# Patient Record
Sex: Male | Born: 1975 | Race: White | Hispanic: No | Marital: Married | Smoking: Never smoker
Health system: Southern US, Community
[De-identification: ages and names within clinical notes are randomized; demographics above are authoritative.]

## PROBLEM LIST (undated history)

## (undated) DIAGNOSIS — IMO0001 Reserved for inherently not codable concepts without codable children: Secondary | ICD-10-CM

## (undated) DIAGNOSIS — F329 Major depressive disorder, single episode, unspecified: Secondary | ICD-10-CM

## (undated) DIAGNOSIS — H9190 Unspecified hearing loss, unspecified ear: Secondary | ICD-10-CM

## (undated) DIAGNOSIS — I10 Essential (primary) hypertension: Secondary | ICD-10-CM

## (undated) DIAGNOSIS — F32A Depression, unspecified: Secondary | ICD-10-CM

## (undated) DIAGNOSIS — F410 Panic disorder [episodic paroxysmal anxiety] without agoraphobia: Secondary | ICD-10-CM

## (undated) DIAGNOSIS — F419 Anxiety disorder, unspecified: Secondary | ICD-10-CM

## (undated) DIAGNOSIS — F319 Bipolar disorder, unspecified: Secondary | ICD-10-CM

## (undated) HISTORY — PX: INNER EAR SURGERY: SHX679

---

## 1998-06-25 ENCOUNTER — Emergency Department (HOSPITAL_COMMUNITY): Admission: EM | Admit: 1998-06-25 | Discharge: 1998-06-25 | Payer: Self-pay | Admitting: Emergency Medicine

## 2010-04-29 ENCOUNTER — Emergency Department (HOSPITAL_BASED_OUTPATIENT_CLINIC_OR_DEPARTMENT_OTHER)
Admission: EM | Admit: 2010-04-29 | Discharge: 2010-04-29 | Payer: Self-pay | Source: Home / Self Care | Admitting: Emergency Medicine

## 2010-06-09 ENCOUNTER — Encounter: Payer: Self-pay | Admitting: Family Medicine

## 2012-04-29 ENCOUNTER — Emergency Department (HOSPITAL_COMMUNITY)
Admission: EM | Admit: 2012-04-29 | Discharge: 2012-04-29 | Disposition: A | Discharge status: Home / Self Care | Payer: No Typology Code available for payment source | Attending: Emergency Medicine | Admitting: Emergency Medicine

## 2012-04-29 ENCOUNTER — Encounter (HOSPITAL_COMMUNITY): Payer: Self-pay | Admitting: Emergency Medicine

## 2012-04-29 ENCOUNTER — Emergency Department (HOSPITAL_COMMUNITY): Payer: No Typology Code available for payment source

## 2012-04-29 DIAGNOSIS — Z79899 Other long term (current) drug therapy: Secondary | ICD-10-CM | POA: Insufficient documentation

## 2012-04-29 DIAGNOSIS — S060XAA Concussion with loss of consciousness status unknown, initial encounter: Secondary | ICD-10-CM

## 2012-04-29 DIAGNOSIS — S069X9A Unspecified intracranial injury with loss of consciousness of unspecified duration, initial encounter: Secondary | ICD-10-CM

## 2012-04-29 DIAGNOSIS — S060X1A Concussion with loss of consciousness of 30 minutes or less, initial encounter: Secondary | ICD-10-CM | POA: Insufficient documentation

## 2012-04-29 DIAGNOSIS — I10 Essential (primary) hypertension: Secondary | ICD-10-CM

## 2012-04-29 DIAGNOSIS — Y9389 Activity, other specified: Secondary | ICD-10-CM | POA: Insufficient documentation

## 2012-04-29 DIAGNOSIS — S060X9A Concussion with loss of consciousness of unspecified duration, initial encounter: Secondary | ICD-10-CM

## 2012-04-29 DIAGNOSIS — S069X1A Unspecified intracranial injury with loss of consciousness of 30 minutes or less, initial encounter: Secondary | ICD-10-CM

## 2012-04-29 HISTORY — DX: Reserved for inherently not codable concepts without codable children: IMO0001

## 2012-04-29 HISTORY — DX: Essential (primary) hypertension: I10

## 2012-04-29 HISTORY — DX: Unspecified hearing loss, unspecified ear: H91.90

## 2012-04-29 MED ORDER — LISINOPRIL-HYDROCHLOROTHIAZIDE 20-25 MG PO TABS: 1.0000 | ORAL_TABLET | Freq: Every day | ORAL | Status: DC

## 2012-04-29 MED ORDER — LISINOPRIL 20 MG PO TABS
20.0000 mg | ORAL_TABLET | Freq: Once | ORAL | Status: AC
  Administered 2012-04-29: 20 mg via ORAL
  Filled 2012-04-29: qty 1

## 2012-04-29 MED ORDER — ALPRAZOLAM 0.5 MG PO TABS
2.0000 mg | ORAL_TABLET | Freq: Once | ORAL | Status: AC
  Administered 2012-04-29: 2 mg via ORAL
  Filled 2012-04-29: qty 4

## 2012-04-29 MED ORDER — HYDROCHLOROTHIAZIDE 12.5 MG PO CAPS
25.0000 mg | ORAL_CAPSULE | Freq: Once | ORAL | Status: AC
  Administered 2012-04-29: 25 mg via ORAL
  Filled 2012-04-29: qty 2

## 2012-04-29 MED ORDER — HYDROCHLOROTHIAZIDE 12.5 MG PO CAPS
12.5000 mg | ORAL_CAPSULE | Freq: Once | ORAL | Status: DC
  Filled 2012-04-29: qty 1

## 2012-04-29 MED ORDER — AMLODIPINE BESYLATE 5 MG PO TABS
5.0000 mg | ORAL_TABLET | Freq: Once | ORAL | Status: DC
  Filled 2012-04-29: qty 1

## 2012-04-29 MED ORDER — METOPROLOL TARTRATE 25 MG PO TABS
50.0000 mg | ORAL_TABLET | Freq: Once | ORAL | Status: DC
  Filled 2012-04-29: qty 2
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
## 2012-04-29 NOTE — ED Notes (Signed)
MD at bedside. 
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
## 2013-02-26 ENCOUNTER — Emergency Department (HOSPITAL_COMMUNITY)
Admission: EM | Admit: 2013-02-26 | Discharge: 2013-02-26 | Disposition: A | Discharge status: Home / Self Care | Payer: Medicare Other | Attending: Emergency Medicine | Admitting: Emergency Medicine

## 2013-02-26 ENCOUNTER — Encounter (HOSPITAL_COMMUNITY): Payer: Self-pay | Admitting: Emergency Medicine

## 2013-02-26 DIAGNOSIS — F4320 Adjustment disorder, unspecified: Secondary | ICD-10-CM

## 2013-02-26 DIAGNOSIS — F319 Bipolar disorder, unspecified: Secondary | ICD-10-CM | POA: Insufficient documentation

## 2013-02-26 DIAGNOSIS — F3130 Bipolar disorder, current episode depressed, mild or moderate severity, unspecified: Secondary | ICD-10-CM

## 2013-02-26 DIAGNOSIS — Z79899 Other long term (current) drug therapy: Secondary | ICD-10-CM | POA: Insufficient documentation

## 2013-02-26 DIAGNOSIS — F329 Major depressive disorder, single episode, unspecified: Secondary | ICD-10-CM

## 2013-02-26 DIAGNOSIS — F4323 Adjustment disorder with mixed anxiety and depressed mood: Secondary | ICD-10-CM | POA: Diagnosis present

## 2013-02-26 DIAGNOSIS — Z8659 Personal history of other mental and behavioral disorders: Secondary | ICD-10-CM

## 2013-02-26 DIAGNOSIS — Z8669 Personal history of other diseases of the nervous system and sense organs: Secondary | ICD-10-CM | POA: Insufficient documentation

## 2013-02-26 DIAGNOSIS — Z630 Problems in relationship with spouse or partner: Secondary | ICD-10-CM

## 2013-02-26 DIAGNOSIS — F3132 Bipolar disorder, current episode depressed, moderate: Secondary | ICD-10-CM | POA: Diagnosis present

## 2013-02-26 DIAGNOSIS — I10 Essential (primary) hypertension: Secondary | ICD-10-CM | POA: Insufficient documentation

## 2013-02-26 MED ORDER — ZIPRASIDONE HCL 20 MG PO CAPS: 80.0000 mg | ORAL_CAPSULE | Freq: Every day | ORAL | Status: DC

## 2013-02-26 MED ORDER — ALPRAZOLAM 0.5 MG PO TABS: 1.0000 mg | ORAL_TABLET | Freq: Three times a day (TID) | ORAL | Status: DC | PRN

## 2013-02-26 MED ORDER — LISINOPRIL 20 MG PO TABS
20.0000 mg | ORAL_TABLET | Freq: Every day | ORAL | Status: DC
  Filled 2013-02-26: qty 1

## 2013-02-26 NOTE — Consult Note (Signed)
Agree with the plan.
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
## 2013-02-26 NOTE — ED Notes (Signed)
Per Dr. Dierdre Highman, pt is refusing blood draw, so we are to hold off on any labs and placing the pt in paper scrubs until psych can evaluate him.

## 2013-02-26 NOTE — ED Notes (Signed)
Pt states he has been fighting depression for several years. Over the last two days, things have gotten worse and he has been fighting with his wife. Wants to be evaluated for depression. Pt denies SI/HI.

## 2013-09-27 ENCOUNTER — Ambulatory Visit
Admission: RE | Admit: 2013-09-27 | Discharge: 2013-09-27 | Disposition: A | Discharge status: Home / Self Care | Payer: Commercial Managed Care - HMO | Source: Ambulatory Visit | Attending: Family Medicine | Admitting: Family Medicine

## 2013-09-27 ENCOUNTER — Other Ambulatory Visit: Payer: Self-pay | Admitting: Family Medicine

## 2013-09-27 DIAGNOSIS — M5412 Radiculopathy, cervical region: Secondary | ICD-10-CM

## 2014-02-10 ENCOUNTER — Ambulatory Visit
Admission: RE | Admit: 2014-02-10 | Discharge: 2014-02-10 | Disposition: A | Discharge status: Home / Self Care | Payer: Commercial Managed Care - HMO | Source: Ambulatory Visit | Attending: Family Medicine | Admitting: Family Medicine

## 2014-02-10 ENCOUNTER — Other Ambulatory Visit: Payer: Self-pay | Admitting: Family Medicine

## 2014-02-10 DIAGNOSIS — R079 Chest pain, unspecified: Secondary | ICD-10-CM

## 2014-06-16 DIAGNOSIS — F4001 Agoraphobia with panic disorder: Secondary | ICD-10-CM | POA: Diagnosis not present

## 2014-06-16 DIAGNOSIS — F319 Bipolar disorder, unspecified: Secondary | ICD-10-CM | POA: Diagnosis not present

## 2014-06-28 DIAGNOSIS — I10 Essential (primary) hypertension: Secondary | ICD-10-CM | POA: Diagnosis not present

## 2014-08-02 DIAGNOSIS — H903 Sensorineural hearing loss, bilateral: Secondary | ICD-10-CM | POA: Diagnosis not present

## 2014-08-22 DIAGNOSIS — F4001 Agoraphobia with panic disorder: Secondary | ICD-10-CM | POA: Diagnosis not present

## 2014-08-22 DIAGNOSIS — F319 Bipolar disorder, unspecified: Secondary | ICD-10-CM | POA: Diagnosis not present

## 2014-09-14 DIAGNOSIS — F319 Bipolar disorder, unspecified: Secondary | ICD-10-CM | POA: Diagnosis not present

## 2014-09-14 DIAGNOSIS — F4001 Agoraphobia with panic disorder: Secondary | ICD-10-CM | POA: Diagnosis not present

## 2014-09-19 DIAGNOSIS — F4001 Agoraphobia with panic disorder: Secondary | ICD-10-CM | POA: Diagnosis not present

## 2014-09-19 DIAGNOSIS — F319 Bipolar disorder, unspecified: Secondary | ICD-10-CM | POA: Diagnosis not present

## 2014-12-13 DIAGNOSIS — F319 Bipolar disorder, unspecified: Secondary | ICD-10-CM | POA: Diagnosis not present

## 2014-12-27 DIAGNOSIS — I10 Essential (primary) hypertension: Secondary | ICD-10-CM | POA: Diagnosis not present

## 2015-02-26 DIAGNOSIS — F319 Bipolar disorder, unspecified: Secondary | ICD-10-CM | POA: Diagnosis not present

## 2015-03-05 ENCOUNTER — Observation Stay (HOSPITAL_BASED_OUTPATIENT_CLINIC_OR_DEPARTMENT_OTHER)
Admission: EM | Admit: 2015-03-05 | Discharge: 2015-03-07 | Disposition: A | Discharge status: Home / Self Care | Payer: Commercial Managed Care - HMO | Attending: Internal Medicine | Admitting: Internal Medicine

## 2015-03-05 ENCOUNTER — Emergency Department (HOSPITAL_BASED_OUTPATIENT_CLINIC_OR_DEPARTMENT_OTHER): Payer: Commercial Managed Care - HMO

## 2015-03-05 ENCOUNTER — Encounter (HOSPITAL_BASED_OUTPATIENT_CLINIC_OR_DEPARTMENT_OTHER): Payer: Self-pay | Admitting: Emergency Medicine

## 2015-03-05 ENCOUNTER — Observation Stay
Admission: AD | Admit: 2015-03-05 | Payer: Self-pay | Source: Other Acute Inpatient Hospital | Admitting: Internal Medicine

## 2015-03-05 DIAGNOSIS — R110 Nausea: Secondary | ICD-10-CM | POA: Diagnosis not present

## 2015-03-05 DIAGNOSIS — F3132 Bipolar disorder, current episode depressed, moderate: Secondary | ICD-10-CM | POA: Insufficient documentation

## 2015-03-05 DIAGNOSIS — F4323 Adjustment disorder with mixed anxiety and depressed mood: Secondary | ICD-10-CM | POA: Diagnosis present

## 2015-03-05 DIAGNOSIS — R079 Chest pain, unspecified: Principal | ICD-10-CM | POA: Insufficient documentation

## 2015-03-05 DIAGNOSIS — Z8659 Personal history of other mental and behavioral disorders: Secondary | ICD-10-CM

## 2015-03-05 DIAGNOSIS — R072 Precordial pain: Secondary | ICD-10-CM | POA: Diagnosis not present

## 2015-03-05 HISTORY — DX: Depression, unspecified: F32.A

## 2015-03-05 HISTORY — DX: Bipolar disorder, unspecified: F31.9

## 2015-03-05 HISTORY — DX: Chest pain, unspecified: R07.9

## 2015-03-05 HISTORY — DX: Anxiety disorder, unspecified: F41.9

## 2015-03-05 HISTORY — DX: Panic disorder (episodic paroxysmal anxiety): F41.0

## 2015-03-05 HISTORY — DX: Major depressive disorder, single episode, unspecified: F32.9

## 2015-03-05 LAB — BASIC METABOLIC PANEL
Anion gap: 7 (ref 5–15)
BUN: 18 mg/dL (ref 6–20)
CALCIUM: 9.1 mg/dL (ref 8.9–10.3)
CO2: 26 mmol/L (ref 22–32)
CREATININE: 1.15 mg/dL (ref 0.61–1.24)
Chloride: 106 mmol/L (ref 101–111)
GFR calc Af Amer: >60 mL/min (ref >60)
GFR calc non Af Amer: >60 mL/min (ref >60)
GLUCOSE: 148 mg/dL — AB (ref 65–99)
Potassium: 3.5 mmol/L (ref 3.5–5.1)
Sodium: 139 mmol/L (ref 135–145)

## 2015-03-05 LAB — TROPONIN I

## 2015-03-05 LAB — DIFFERENTIAL
BASOS ABS: 0.1 10*3/uL (ref 0.0–0.1)
Basophils Relative: 0 %
EOS PCT: 1 %
Eosinophils Absolute: 0.1 10*3/uL (ref 0.0–0.7)
LYMPHS ABS: 3.9 10*3/uL (ref 0.7–4.0)
Lymphocytes Relative: 30 %
MONOS PCT: 7 %
Monocytes Absolute: 0.9 10*3/uL (ref 0.1–1.0)
NEUTROS PCT: 62 %
Neutro Abs: 8.1 10*3/uL — ABNORMAL HIGH (ref 1.7–7.7)

## 2015-03-05 LAB — CBC
HCT: 44.3 % (ref 39.0–52.0)
Hemoglobin: 15.8 g/dL (ref 13.0–17.0)
MCH: 29.6 pg (ref 26.0–34.0)
MCHC: 35.7 g/dL (ref 30.0–36.0)
MCV: 83.1 fL (ref 78.0–100.0)
Platelets: 295 10*3/uL (ref 150–400)
RBC: 5.33 MIL/uL (ref 4.22–5.81)
RDW: 13.7 % (ref 11.5–15.5)
WBC: 13.1 10*3/uL — ABNORMAL HIGH (ref 4.0–10.5)

## 2015-03-05 MED ORDER — ZIPRASIDONE HCL 80 MG PO CAPS
80.0000 mg | ORAL_CAPSULE | Freq: Every day | ORAL | Status: DC
Start: 2015-03-05 — End: 2015-03-06
  Administered 2015-03-06: 80 mg via ORAL
  Filled 2015-03-05 (×3): qty 1

## 2015-03-05 MED ORDER — ASPIRIN 81 MG PO CHEW
324.0000 mg | CHEWABLE_TABLET | Freq: Once | ORAL | Status: AC
  Administered 2015-03-05: 324 mg via ORAL
  Filled 2015-03-05: qty 4

## 2015-03-05 MED ORDER — GABAPENTIN 100 MG PO CAPS
100.0000 mg | ORAL_CAPSULE | Freq: Three times a day (TID) | ORAL | Status: DC
  Administered 2015-03-05 – 2015-03-06 (×3): 100 mg via ORAL
  Filled 2015-03-05 (×4): qty 1

## 2015-03-05 MED ORDER — METOPROLOL SUCCINATE ER 50 MG PO TB24
50.0000 mg | ORAL_TABLET | Freq: Every day | ORAL | Status: DC
  Administered 2015-03-06 – 2015-03-07 (×2): 50 mg via ORAL
  Filled 2015-03-05 (×3): qty 1

## 2015-03-05 MED ORDER — LOSARTAN POTASSIUM 50 MG PO TABS
100.0000 mg | ORAL_TABLET | Freq: Every day | ORAL | Status: DC
  Administered 2015-03-06 – 2015-03-07 (×2): 100 mg via ORAL
  Filled 2015-03-05 (×3): qty 2

## 2015-03-05 MED ORDER — NITROGLYCERIN 0.4 MG SL SUBL
0.4000 mg | SUBLINGUAL_TABLET | SUBLINGUAL | Status: AC | PRN
  Administered 2015-03-05 (×3): 0.4 mg via SUBLINGUAL
  Filled 2015-03-05 (×2): qty 1

## 2015-03-05 MED ORDER — ALPRAZOLAM 0.5 MG PO TABS
1.0000 mg | ORAL_TABLET | Freq: Four times a day (QID) | ORAL | Status: DC | PRN
Start: 2015-03-05 — End: 2015-03-07
  Administered 2015-03-06 (×3): 1 mg via ORAL
  Filled 2015-03-05 (×4): qty 2

## 2015-03-05 NOTE — ED Provider Notes (Signed)
CSN: [id number]     Arrival date & time 03/05/15  1954 History  By signing my name below, I, Lyndel Safe, attest that this documentation has been prepared under the direction and in the presence of Dione Booze, MD. Electronically Signed: Lyndel Safe, ED Scribe. 03/05/2015. 9:01 PM.   Chief Complaint  Patient presents with  . Chest Pain   The history is provided by the patient and the spouse. No language interpreter was used.   HPI Comments: Darrell Nunez is a 39 y.o. male, with a PMhx of anxiety and HTN, who presents to the Emergency Department complaining of waxing and waning, 8/10, CP that he describes as a pressure to mid sternum of chest onset 4 hours ago. Per wife, the pt began to complain of pain in mid chest that radiated to upper back onset 4 hours ago while out running errands. Pt associates nausea, decreased appetite, and diaphoresis, but he states he sweats excessively daily at baseline. Laying down alleviates his CP and movement exacerbates his CP. Denies cough, SOB, vomiting, diarrhea or constipation. No PMhx of DM or HLD. Pt does not smoke tobacco products or consume EtOH. PFhx of HTN in mother and maternal grandfather. Strong family history of DM. PCP: Merri Brunette, MD.    Past Medical History  Diagnosis Date  . Hypertension   . Hearing impairment    Past Surgical History  Procedure Laterality Date  . Inner ear surgery     History reviewed. No pertinent family history. Social History  Substance Use Topics  . Smoking status: Never Smoker   . Smokeless tobacco: Never Used  . Alcohol Use: No    Review of Systems  Constitutional: Positive for diaphoresis and appetite change.  Respiratory: Negative for cough and shortness of breath.   Cardiovascular: Positive for chest pain.  Gastrointestinal: Positive for nausea. Negative for vomiting, diarrhea and constipation.  All other systems reviewed and are negative.  Allergies  Depakote and Lamictal  Home  Medications   Prior to Admission medications   Medication Sig Start Date End Date Taking? Authorizing Provider  losartan (COZAAR) 100 MG tablet Take 100 mg by mouth daily.   Yes Historical Provider, MD  metoprolol succinate (TOPROL-XL) 50 MG 24 hr tablet Take 50 mg by mouth daily. Take with or immediately following a meal.   Yes Historical Provider, MD  ALPRAZolam Prudy Feeler) 1 MG tablet Take 1 mg by mouth 4 (four) times daily as needed. Anxiety    Historical Provider, MD  aspirin-acetaminophen-caffeine (EXCEDRIN MIGRAINE) [phone] MG per tablet Take 1 tablet by mouth every 6 (six) hours as needed for pain (headache).    Historical Provider, MD  gabapentin (NEURONTIN) 100 MG capsule Take 100 mg by mouth 3 (three) times daily.    Historical Provider, MD  Pseudoephedrine-Ibuprofen (ADVIL COLD & SINUS LIQUI-GELS PO) Take 2 capsules by mouth as needed. cold    Historical Provider, MD  ziprasidone (GEODON) 80 MG capsule Take 80 mg by mouth at bedtime.    Historical Provider, MD   BP 187/109 mmHg  Pulse 64  Temp(Src) 98.3 F (36.8 C) (Oral)  Resp 20  Ht  (1.727 m)  Wt 277 lb (125.646 kg)  BMI 42.13 kg/m2  SpO2 100% Physical Exam  Constitutional: He is oriented to person, place, and time. He appears well-developed and well-nourished.  HENT:  Head: Normocephalic and atraumatic.  Eyes: EOM are normal. Pupils are equal, round, and reactive to light.  Neck: Normal range of motion. Neck supple. No  JVD present.  Cardiovascular: Normal rate, regular rhythm and normal heart sounds.   No murmur heard. Pulmonary/Chest: Effort normal and breath sounds normal. He has no wheezes. He has no rales. He exhibits tenderness.  Moderate tenderness left parasternal that does not reproduce his pain.   Abdominal: Soft. Bowel sounds are normal. He exhibits no distension and no mass. There is no tenderness.  Musculoskeletal: Normal range of motion. He exhibits no edema.  Lymphadenopathy:    He has no cervical  adenopathy.  Neurological: He is alert and oriented to person, place, and time. No cranial nerve deficit. He exhibits normal muscle tone. Coordination normal.  Skin: Skin is warm and dry. No rash noted.  Psychiatric: He has a normal mood and affect. His behavior is normal. Judgment and thought content normal.  Nursing note and vitals reviewed.   ED Course  Procedures  DIAGNOSTIC STUDIES: Oxygen Saturation is 100% on RA, normal by my interpretation.    COORDINATION OF CARE: 9:00 PM Discussed treatment plan with pt at bedside and pt agreed to plan. Will order 324mg  aspirin and NTG 0.4mg . Cardiac workup ordered.    Labs Review Results for orders placed or performed during the hospital encounter of 03/05/15  Basic metabolic panel  Result Value Ref Range   Sodium 139 135 - 145 mmol/L   Potassium 3.5 3.5 - 5.1 mmol/L   Chloride 106 101 - 111 mmol/L   CO2 26 22 - 32 mmol/L   Glucose, Bld 148 (H) 65 - 99 mg/dL   BUN 18 6 - 20 mg/dL   Creatinine, Ser 1.611.15 0.61 - 1.24 mg/dL   Calcium 9.1 8.9 - 09.610.3 mg/dL   GFR calc non Af Amer >60 >60 mL/min   GFR calc Af Amer >60 >60 mL/min   Anion gap 7 5 - 15  CBC  Result Value Ref Range   WBC 13.1 (H) 4.0 - 10.5 K/uL   RBC 5.33 4.22 - 5.81 MIL/uL   Hemoglobin 15.8 13.0 - 17.0 g/dL   HCT 04.544.3 40.939.0 - 81.152.0 %   MCV 83.1 78.0 - 100.0 fL   MCH 29.6 26.0 - 34.0 pg   MCHC 35.7 30.0 - 36.0 g/dL   RDW 91.413.7 78.211.5 - 95.615.5 %   Platelets 295 150 - 400 K/uL  Troponin I  Result Value Ref Range   Troponin I <0.03 <0.031 ng/mL  Differential  Result Value Ref Range   Neutro Abs 8.1 (H) 1.7 - 7.7 K/uL   Lymphs Abs 3.9 0.7 - 4.0 K/uL   Monocytes Absolute 0.9 0.1 - 1.0 K/uL   Eosinophils Absolute 0.1 0.0 - 0.7 K/uL   Basophils Absolute 0.1 0.0 - 0.1 K/uL   Neutrophils Relative % 62 %   Lymphocytes Relative 30 %   Monocytes Relative 7 %   Eosinophils Relative 1 %   Basophils Relative 0 %   Smear Review MORPHOLOGY UNREMARKABLE    Imaging Review Dg Chest 2  View  03/05/2015  CLINICAL DATA:  Central to LEFT side chest pain beginning at 1600 hours today, nausea, diaphoresis, history hypertension EXAM: CHEST  2 VIEW COMPARISON:  02/10/2014 FINDINGS: Normal heart size, mediastinal contours, and pulmonary vascularity. Lungs clear. No pleural effusion or pneumothorax. Bones unremarkable. IMPRESSION: Normal exam. Electronically Signed   By: Ulyses SouthwardMark  Boles M.D.   On: 03/05/2015 20:54   I have personally reviewed and evaluated these images and lab results as part of my medical decision-making.   EKG Interpretation   Date/Time:  Monday March 05 2015 20:08:01 EDT Ventricular Rate:  71 PR Interval:  164 QRS Duration: 102 QT Interval:  410 QTC Calculation: 445 R Axis:   41 Text Interpretation:  Normal sinus rhythm Cannot rule out Inferior infarct  , age undetermined Abnormal ECG No old tracing to compare Confirmed by  [facility]  MD, Pina Sirianni ([id number]) on 03/05/2015 8:07:23 PM      MDM   Final diagnoses:  Chest pain, unspecified    Chest pain of uncertain cause. He was given aspirin in the ED and was given nitroglycerin with complete relief of pain although did require 3 nitroglycerin to achieve relief. ECG is unremarkable and initial troponin is negative. Heart score is only 2, but I am concerned enough about his history that I feel he needs monitoring for serial troponins and may need cardiology referral for stress testing. This was cleaned the patient and family. Case is discussed with Dr. Julian Reil of [facility] agrees to admit the patient.  I, Cornell Bourbon, personally performed the services described in this documentation. All medical record entries made by the scribe were at my direction and in my presence.  I have reviewed the chart and discharge instructions and agree that the record reflects my personal performance and is accurate and complete. Antinette Keough.  03/05/2015. 11:30 PM.      Dione Booze, MD 03/05/15 2330

## 2015-03-05 NOTE — ED Notes (Signed)
Pt was only given 2 baby asa,  After scanning asa pt told rn he had already taken 2

## 2015-03-05 NOTE — ED Notes (Signed)
Pt c/i mid chest pain that started in rt rib area radiating into rt abd to rt chest then to cemntral chest area,  Very sweaty,  Denies n/v

## 2015-03-05 NOTE — ED Notes (Signed)
Patient states that he is having Chest pain today that started about 4 pm - the patient is nauseated and diaphoretic. Patient is very anxious and reports that he is having cotton mouth.

## 2015-03-05 NOTE — ED Notes (Signed)
Patient is pale and very diaphoretic in triage, however as he sits in triage longer he is less and less anxious. The patient however is grimacing in pain frequently and asking for water.

## 2015-03-06 ENCOUNTER — Encounter (HOSPITAL_COMMUNITY): Payer: Commercial Managed Care - HMO

## 2015-03-06 ENCOUNTER — Observation Stay (HOSPITAL_BASED_OUTPATIENT_CLINIC_OR_DEPARTMENT_OTHER): Payer: Commercial Managed Care - HMO

## 2015-03-06 ENCOUNTER — Observation Stay (HOSPITAL_COMMUNITY): Payer: Commercial Managed Care - HMO

## 2015-03-06 ENCOUNTER — Encounter (HOSPITAL_COMMUNITY): Payer: Self-pay | Admitting: *Deleted

## 2015-03-06 DIAGNOSIS — R079 Chest pain, unspecified: Secondary | ICD-10-CM | POA: Diagnosis not present

## 2015-03-06 DIAGNOSIS — R072 Precordial pain: Secondary | ICD-10-CM

## 2015-03-06 DIAGNOSIS — F3132 Bipolar disorder, current episode depressed, moderate: Secondary | ICD-10-CM | POA: Diagnosis not present

## 2015-03-06 LAB — TROPONIN I
Troponin I: <0.03 ng/mL (ref <0.031)
Troponin I: <0.03 ng/mL (ref <0.031)

## 2015-03-06 MED ORDER — ONDANSETRON HCL 4 MG/2ML IJ SOLN: 4.0000 mg | Freq: Four times a day (QID) | INTRAMUSCULAR | Status: DC | PRN

## 2015-03-06 MED ORDER — TECHNETIUM TC 99M SESTAMIBI GENERIC - CARDIOLITE
30.0000 | Freq: Once | INTRAVENOUS | Status: AC | PRN
  Administered 2015-03-06: 30 via INTRAVENOUS

## 2015-03-06 MED ORDER — REGADENOSON 0.4 MG/5ML IV SOLN
INTRAVENOUS | Status: AC
  Administered 2015-03-06: 0.4 mg via INTRAVENOUS
  Filled 2015-03-06: qty 5

## 2015-03-06 MED ORDER — REGADENOSON 0.4 MG/5ML IV SOLN
0.4000 mg | Freq: Once | INTRAVENOUS | Status: AC
Start: 2015-03-06 — End: 2015-03-06
  Administered 2015-03-06: 0.4 mg via INTRAVENOUS
  Filled 2015-03-06: qty 5

## 2015-03-06 MED ORDER — ACETAMINOPHEN 325 MG PO TABS
650.0000 mg | ORAL_TABLET | ORAL | Status: DC | PRN
  Administered 2015-03-06 – 2015-03-07 (×4): 650 mg via ORAL
  Filled 2015-03-06 (×4): qty 2

## 2015-03-06 MED ORDER — ACETAMINOPHEN 325 MG PO TABS: 650.0000 mg | ORAL_TABLET | Freq: Four times a day (QID) | ORAL | Status: DC | PRN

## 2015-03-06 MED ORDER — OXCARBAZEPINE 150 MG PO TABS: 150.0000 mg | ORAL_TABLET | Freq: Every day | ORAL | Status: DC

## 2015-03-06 MED ORDER — ENOXAPARIN SODIUM 40 MG/0.4ML ~~LOC~~ SOLN
40.0000 mg | SUBCUTANEOUS | Status: DC
  Administered 2015-03-06 – 2015-03-07 (×2): 40 mg via SUBCUTANEOUS
  Filled 2015-03-06 (×2): qty 0.4

## 2015-03-06 MED ORDER — OXCARBAZEPINE 150 MG PO TABS
150.0000 mg | ORAL_TABLET | Freq: Every day | ORAL | Status: DC
  Administered 2015-03-06 (×2): 150 mg via ORAL
  Filled 2015-03-06 (×3): qty 1

## 2015-03-06 MED ORDER — ZIPRASIDONE HCL 40 MG PO CAPS
40.0000 mg | ORAL_CAPSULE | Freq: Every day | ORAL | Status: DC
Start: 2015-03-06 — End: 2015-03-07
  Administered 2015-03-06: 40 mg via ORAL
  Filled 2015-03-06 (×3): qty 1

## 2015-03-06 NOTE — H&P (Signed)
[facility] History and Physical  Sabian Johney Frame ZOX:[id number] DOB: 1976/03/01 DOA: 03/05/2015  Referring physician: EDP PCP: Leanor Rubenstein, MD   Chief Complaint: Chest pain   HPI: Darrell Nunez is a 39 y.o. male with h/o HTN, patient presents to the ED with c/o chest pain.  8/10 in intensity, waxing and waning symptoms.  Pressure like sensation to mid sternum.  Onset 4 hours PTA while out running errands.  Associated nausea.  No SOB, vomiting.  Activity makes CP worse, laying down makes it better.  Review of Systems: Systems reviewed.  As above, otherwise negative  Past Medical History  Diagnosis Date  . Hypertension   . Hearing impairment   . Bipolar disorder (HCC)   . Depression   . Anxiety   . Panic attacks    Past Surgical History  Procedure Laterality Date  . Inner ear surgery     Social History:  reports that he has never smoked. He has never used smokeless tobacco. He reports that he does not drink alcohol or use illicit drugs.  Allergies  Allergen Reactions  . Depakote [Divalproex Sodium] Nausea And Vomiting  . Lamictal [Lamotrigine] Nausea And Vomiting    Family History  Problem Relation Age of Onset  . Other Mother     factor five leiden  . Heart attack Maternal Grandfather   . Diabetes Maternal Grandfather   . Diabetes Maternal Grandmother      Prior to Admission medications   Medication Sig Start Date End Date Taking? Authorizing Provider  losartan (COZAAR) 100 MG tablet Take 100 mg by mouth daily.   Yes Historical Provider, MD  metoprolol succinate (TOPROL-XL) 50 MG 24 hr tablet Take 50 mg by mouth daily. Take with or immediately following a meal.   Yes Historical Provider, MD  ALPRAZolam Prudy Feeler) 1 MG tablet Take 1 mg by mouth 4 (four) times daily as needed. Anxiety    Historical Provider, MD  aspirin-acetaminophen-caffeine (EXCEDRIN MIGRAINE) [phone] MG per tablet Take 1 tablet by mouth every 6 (six) hours as needed for pain (headache).     Historical Provider, MD  gabapentin (NEURONTIN) 100 MG capsule Take 100 mg by mouth 3 (three) times daily.    Historical Provider, MD  Pseudoephedrine-Ibuprofen (ADVIL COLD & SINUS LIQUI-GELS PO) Take 2 capsules by mouth as needed. cold    Historical Provider, MD  ziprasidone (GEODON) 80 MG capsule Take 80 mg by mouth at bedtime.    Historical Provider, MD   Physical Exam: Filed Vitals:   03/06/15 0125  BP: 145/100  Pulse: 79  Temp: 98.8 F (37.1 C)  Resp: 16    BP 145/100 mmHg  Pulse 79  Temp(Src) 98.8 F (37.1 C) (Oral)  Resp 16  Ht  (1.727 m)  Wt 128.368 kg (283 lb)  BMI 43.04 kg/m2  SpO2 98%  General Appearance:    Alert, oriented, no distress, appears stated age  Head:    Normocephalic, atraumatic  Eyes:    PERRL, EOMI, sclera non-icteric        Nose:   Nares without drainage or epistaxis. Mucosa, turbinates normal  Throat:   Moist mucous membranes. Oropharynx without erythema or exudate.  Neck:   Supple. No carotid bruits.  No thyromegaly.  No lymphadenopathy.   Back:     No CVA tenderness, no spinal tenderness  Lungs:     Clear to auscultation bilaterally, without wheezes, rhonchi or rales  Chest wall:    No tenderness to palpitation  Heart:  Regular rate and rhythm without murmurs, gallops, rubs  Abdomen:     Soft, non-tender, nondistended, normal bowel sounds, no organomegaly  Genitalia:    deferred  Rectal:    deferred  Extremities:   No clubbing, cyanosis or edema.  Pulses:   2+ and symmetric all extremities  Skin:   Skin color, texture, turgor normal, no rashes or lesions  Lymph nodes:   Cervical, supraclavicular, and axillary nodes normal  Neurologic:   CNII-XII intact. Normal strength, sensation and reflexes      throughout    Labs on Admission:  Basic Metabolic Panel:  Recent Labs Lab 03/05/15 2020  NA 139  K 3.5  CL 106  CO2 26  GLUCOSE 148*  BUN 18  CREATININE 1.15  CALCIUM 9.1   Liver Function Tests: No results for input(s): AST,  ALT, ALKPHOS, BILITOT, PROT, ALBUMIN in the last 168 hours. No results for input(s): LIPASE, AMYLASE in the last 168 hours. No results for input(s): AMMONIA in the last 168 hours. CBC:  Recent Labs Lab 03/05/15 2020  WBC 13.1*  NEUTROABS 8.1*  HGB 15.8  HCT 44.3  MCV 83.1  PLT 295   Cardiac Enzymes:  Recent Labs Lab 03/05/15 2020 03/05/15 2340  TROPONINI <0.03 <0.03    BNP (last 3 results) No results for input(s): PROBNP in the last 8760 hours. CBG: No results for input(s): GLUCAP in the last 168 hours.  Radiological Exams on Admission: Dg Chest 2 View  03/05/2015  CLINICAL DATA:  Central to LEFT side chest pain beginning at 1600 hours today, nausea, diaphoresis, history hypertension EXAM: CHEST  2 VIEW COMPARISON:  02/10/2014 FINDINGS: Normal heart size, mediastinal contours, and pulmonary vascularity. Lungs clear. No pleural effusion or pneumothorax. Bones unremarkable. IMPRESSION: Normal exam. Electronically Signed   By: Ulyses SouthwardMark  Boles M.D.   On: 03/05/2015 20:54    EKG: Independently reviewed. Does appear to have Q waves in III and AVF, no old tracing for comparison.  Assessment/Plan Principal Problem:   Chest pain, unspecified Active Problems:   Bipolar affective disorder, depressed, moderate (HCC)   1. Chest pain, unspecified -  1. Serial trops ordered 2. Chest pain obs pathway 3. NPO after midnight 4. Tele monitor 5. HEART score of 4    Code Status: Full Code  Family Communication: Family at bedside Disposition Plan: Admit to obs   Time spent: 50 min  Cleland Simkins M. [facility] Pager [phone]  If 7AM-7PM, please contact the day team taking care of the patient [email or web address] Password [email or web address] 03/06/2015, 3:05 AM

## 2015-03-06 NOTE — Consult Note (Signed)
CARDIOLOGY CONSULT NOTE   Patient ID: Sacramento Monds MRN: [id number] DOB/AGE: [birth year] 39 y.o.  Admit date: 03/05/2015  Primary Physician   Leanor Rubenstein, MD Primary Cardiologist   New Reason for Consultation   CP  HPI: Darrell Nunez is a 39 y.o. male with a history of HTN, bipolar disorders, anxiety and hearing impairment who presented to ED 03/05/15 for evaluation of chest pain.    No prior cardiac history. Never seen by any cardiologist. Patient has developed substernal chest pain while driving back to home from bank, rates as 10 out of 10. He described the pain as a pressure/achy that associated with diaphoresis. He went home and he continued to have a substernal chest pressure for approximately 4 hours, that him to come to ED for further evaluation. His pain improved after sublingual nitroglycerin x 3 in ED. He is somewhat sweaty at baseline. Per wife, nothing made his pain better or worse.   The patient denies nausea, vomiting, fever, palpitations, shortness of breath, orthopnea, PND, dizziness, syncope, cough, congestion, abdominal pain, hematochezia, melena, lower extremity edema. Denies tobacco smoking, alcohol abuse or any illicit drugs. His maternal grandfather had a heart attack in his 26s. Maternal grandmother has diabetes, hypertension, and pacemaker placement. His mom has hypertension and diabetes. Unknown father past medical history. Per patient his last cholesterol check 2 months ago was normal.  Currently he denies any chest pain or shortness of breath. Troponin x 3 negative. EKG without acute ST or T-wave abnormality. Telemetry without any arrhythmia. Chest x-ray without acute finding.  Past Medical History  Diagnosis Date  . Hypertension   . Hearing impairment   . Bipolar disorder (HCC)   . Depression   . Anxiety   . Panic attacks      Past Surgical History  Procedure Laterality Date  . Inner ear surgery      Allergies  Allergen Reactions  . Depakote  [Divalproex Sodium] Nausea And Vomiting  . Lamictal [Lamotrigine] Nausea And Vomiting    I have reviewed the patient's current medications . enoxaparin (LOVENOX) injection  40 mg Subcutaneous Q24H  . gabapentin  100 mg Oral TID  . losartan  100 mg Oral Daily  . metoprolol succinate  50 mg Oral Daily  . OXcarbazepine  150 mg Oral QHS  . ziprasidone  80 mg Oral QHS     acetaminophen, ALPRAZolam, ondansetron (ZOFRAN) IV  Prior to Admission medications   Medication Sig Start Date End Date Taking? Authorizing Provider  losartan (COZAAR) 100 MG tablet Take 100 mg by mouth daily.   Yes Historical Provider, MD  metoprolol succinate (TOPROL-XL) 50 MG 24 hr tablet Take 50 mg by mouth daily. Take with or immediately following a meal.   Yes Historical Provider, MD  ALPRAZolam Prudy Feeler) 1 MG tablet Take 1 mg by mouth 4 (four) times daily as needed. Anxiety    Historical Provider, MD  aspirin-acetaminophen-caffeine (EXCEDRIN MIGRAINE) [phone] MG per tablet Take 1 tablet by mouth every 6 (six) hours as needed for pain (headache).    Historical Provider, MD  gabapentin (NEURONTIN) 100 MG capsule Take 100 mg by mouth 3 (three) times daily.    Historical Provider, MD  Pseudoephedrine-Ibuprofen (ADVIL COLD & SINUS LIQUI-GELS PO) Take 2 capsules by mouth as needed. cold    Historical Provider, MD  ziprasidone (GEODON) 80 MG capsule Take 80 mg by mouth at bedtime.    Historical Provider, MD     Social History   Social History  . Marital  Status: Married    Spouse Name: N/A  . Number of Children: N/A  . Years of Education: N/A   Occupational History  . Not on file.   Social History Main Topics  . Smoking status: Never Smoker   . Smokeless tobacco: Never Used  . Alcohol Use: No  . Drug Use: No  . Sexual Activity: Not on file   Other Topics Concern  . Not on file   Social History Narrative    Family Status  Relation Status Death Age  . Mother Alive   . Maternal Grandfather Deceased   .  Maternal Grandmother Alive    Family History  Problem Relation Age of Onset  . Other Mother     factor five leiden  . Heart attack Maternal Grandfather   . Diabetes Maternal Grandfather   . Diabetes Maternal Grandmother      ROS:  Full 14 point review of systems complete and found to be negative unless listed above.  Physical Exam: Blood pressure 145/100, pulse 79, temperature 98.8 F (37.1 C), temperature source Oral, resp. rate 16, height 5\' 8"  (1.727 m), weight 283 lb (128.368 kg), SpO2 98 %.  General: Well developed, well nourished, male in no acute distress Head: Eyes PERRLA, No xanthomas. Normocephalic and atraumatic, oropharynx without edema or exudate.  Lungs: Resp regular and unlabored, CTA. Heart: RRR no s3, s4, or murmurs..   Neck: No carotid bruits. No lymphadenopathy.  No JVD. Abdomen: Bowel sounds present, abdomen soft and non-tender without masses or hernias noted. Msk:  No spine or cva tenderness. No weakness, no joint deformities or effusions. Extremities: No clubbing, cyanosis or edema. DP/PT/Radials 2+ and equal bilaterally. Neuro: Alert and oriented X 3. No focal deficits noted. Psych:  Good affect, responds appropriately Skin: No rashes or lesions noted.  Labs:   Lab Results  Component Value Date   WBC 13.1* 03/05/2015   HGB 15.8 03/05/2015   HCT 44.3 03/05/2015   MCV 83.1 03/05/2015   PLT 295 03/05/2015   No results for input(s): INR in the last 72 hours.  Recent Labs Lab 03/05/15 2020  NA 139  K 3.5  CL 106  CO2 26  BUN 18  CREATININE 1.15  CALCIUM 9.1  GLUCOSE 148*   No results found for: MG  Recent Labs  03/05/15 2020 03/05/15 2340 03/06/15 0531  TROPONINI <0.03 <0.03 <0.03    Echo: pending  ECG:  Vent. rate 71 BPM PR interval 164 ms QRS duration 102 ms QT/QTc 410/445 ms  Radiology:  Dg Chest 2 View  03/05/2015  CLINICAL DATA:  Central to LEFT side chest pain beginning at 1600 hours today, nausea, diaphoresis, history  hypertension EXAM: CHEST  2 VIEW COMPARISON:  02/10/2014 FINDINGS: Normal heart size, mediastinal contours, and pulmonary vascularity. Lungs clear. No pleural effusion or pneumothorax. Bones unremarkable. IMPRESSION: Normal exam. Electronically Signed   By: Ulyses SouthwardMark  Boles M.D.   On: 03/05/2015 20:54    ASSESSMENT AND PLAN:     1. Chest pain - His chest pain has both typical and atypical features. Sternal chest pressure/achyness associated with diaphoresis that lasted for approximately 4 hours. His pain improved after sublingual nitroglycerin x 3. Troponin x 3 negative. EKG without acute ST or T-wave abnormality. Telemetry without any arrhythmia. Chest x-ray without acute finding. - At presentation to ED his blood pressure was 182/120. Currently his chest pain from uncontrolled hypertension and most recent blood pressure reading 145/100. - He has a strong family history of cardiac  problems. Given her morbid obesity and hypertension, will discuss plan with Dr. Myrtis Ser.   2. HTN - Continue Toprol XL and losartan.  - At presentation his BP was 180/120. As above  3. Hyperglycemia - consider HgbA1c  4. Bipolar affective disorder, depressed, moderate (HCC) - Per primary  5. morbid obesity - Estimated body mass index is 43.04 kg/(m^2) as calculated from the following:   Height as of this encounter:  (1.727 m).   Weight as of this encounter: 283 lb (128.368 kg).  - Encouraged lifestyle change and daily exercise  Signed: Bhagat,Bhavinkumar, PA 03/06/2015, 7:08 AM Patient seen and examined. I agree with the assessment and plan as detailed above. See also my additional thoughts below.   Patient's chest pain was concerning. He described it as 10 had a 10 pain that lasted for 4 hours. However despite this, troponins are normal. There are no acute EKG changes. His EKG does show small inferior Q waves. I feel that we should proceed with a complete noninvasive evaluation in the hospital. I've ordered a  2-D echo and a nuclear stress study. These will be done today. His blood pressure needs to be treated. I spoke in person in the room with the patient's wife and mother.  Willa Rough, MD, [facility] 03/06/2015 8:07 AM

## 2015-03-06 NOTE — Progress Notes (Signed)
Day 1 of 2-day study complete without any complications. Will have resting images tomorrow followed by official read. [facility] will be reading.  Signed, Ellsworth LennoxBrittany M Ammiel Guiney, PA-C 03/06/2015, 9:32 AM Pager: [phone]

## 2015-03-06 NOTE — Progress Notes (Signed)
Patient Demographics  Darrell Nunez, is a 39 y.o. male, DOB - [birth year], ZOX:[id number]  Admit date - 03/05/2015   Admitting Physician Hillary Bow, DO  Outpatient Primary MD for the patient is Leanor Rubenstein, MD  LOS -    Chief Complaint  Patient presents with  . Chest Pain         Subjective:   Darrell Nunez today has, No headache,No abdominal pain - No Nausea, No new weakness tingling or numbness, No Cough - SOB. No re occurance of chest pain during hospital stay.  Assessment & Plan    Principal Problem:   Chest pain, unspecified Active Problems:   Bipolar affective disorder, depressed, moderate (HCC)  Chest Pain: - Chest pain resolved with nitroglycerin , cardiology consulted , plan is for nuclear stress test , will need 2 day stress test , already finished part 1 today , 2-D echo pending , Negative troponin  3 , continue on telemetry , continue with aspirin.  Hypertension - Continue with Toprol-XL and losartan  Bipolar affective disorder - Continue with home medication   Code Status: Full  Family Communication: Wife and mother at bedside  Disposition Plan: Home when stable   Procedures  None   Consults   Cardiology   Medications  Scheduled Meds: . enoxaparin (LOVENOX) injection  40 mg Subcutaneous Q24H  . gabapentin  100 mg Oral TID  . losartan  100 mg Oral Daily  . metoprolol succinate  50 mg Oral Daily  . OXcarbazepine  150 mg Oral QHS  . ziprasidone  80 mg Oral QHS   Continuous Infusions:  PRN Meds:.acetaminophen, ALPRAZolam, ondansetron (ZOFRAN) IV  DVT Prophylaxis  Lovenox  Lab Results  Component Value Date   PLT 295 03/05/2015    Antibiotics    Anti-infectives    None          Objective:   Filed Vitals:   03/06/15 0924 03/06/15 0927 03/06/15 0929 03/06/15 0931  BP: 115/84 125/87 126/83 126/93  Pulse: 81 86 84 82  Temp:        TempSrc:      Resp:      Height:      Weight:      SpO2:        Wt Readings from Last 3 Encounters:  03/06/15 128.368 kg (283 lb)  02/26/13 119.75 kg (264 lb)     Intake/Output Summary (Last 24 hours) at 03/06/15 1203 Last data filed at 03/06/15 0200  Gross per 24 hour  Intake    200 ml  Output      0 ml  Net    200 ml     Physical Exam  Awake Alert, Oriented X 3, wearing hearing aid .AT,PERRAL Supple Neck,No JVD, No cervical lymphadenopathy appriciated.  Symmetrical Chest wall movement, Good air movement bilaterally, CTAB RRR,No Gallops,Rubs or new Murmurs, No Parasternal Heave +ve B.Sounds, Abd Soft, No tenderness, No organomegaly appriciated, No rebound - guarding or rigidity. No Cyanosis, Clubbing or edema, No new Rash or bruise     Data Review   Micro Results No results found for this or any previous visit (from the past 240 hour(s)).  Radiology Reports Dg Chest 2 View  03/05/2015  CLINICAL DATA:  Central  to LEFT side chest pain beginning at 1600 hours today, nausea, diaphoresis, history hypertension EXAM: CHEST  2 VIEW COMPARISON:  02/10/2014 FINDINGS: Normal heart size, mediastinal contours, and pulmonary vascularity. Lungs clear. No pleural effusion or pneumothorax. Bones unremarkable. IMPRESSION: Normal exam. Electronically Signed   By: Ulyses SouthwardMark  Boles M.D.   On: 03/05/2015 20:54     CBC  Recent Labs Lab 03/05/15 2020  WBC 13.1*  HGB 15.8  HCT 44.3  PLT 295  MCV 83.1  MCH 29.6  MCHC 35.7  RDW 13.7  LYMPHSABS 3.9  MONOABS 0.9  EOSABS 0.1  BASOSABS 0.1    Chemistries   Recent Labs Lab 03/05/15 2020  NA 139  K 3.5  CL 106  CO2 26  GLUCOSE 148*  BUN 18  CREATININE 1.15  CALCIUM 9.1   ------------------------------------------------------------------------------------------------------------------ estimated creatinine clearance is 111.6 mL/min (by C-G formula based on Cr of  1.15). ------------------------------------------------------------------------------------------------------------------ No results for input(s): HGBA1C in the last 72 hours. ------------------------------------------------------------------------------------------------------------------ No results for input(s): CHOL, HDL, LDLCALC, TRIG, CHOLHDL, LDLDIRECT in the last 72 hours. ------------------------------------------------------------------------------------------------------------------ No results for input(s): TSH, T4TOTAL, T3FREE, THYROIDAB in the last 72 hours.  Invalid input(s): FREET3 ------------------------------------------------------------------------------------------------------------------ No results for input(s): VITAMINB12, FOLATE, FERRITIN, TIBC, IRON, RETICCTPCT in the last 72 hours.  Coagulation profile No results for input(s): INR, PROTIME in the last 168 hours.  No results for input(s): DDIMER in the last 72 hours.  Cardiac Enzymes  Recent Labs Lab 03/05/15 2020 03/05/15 2340 03/06/15 0531  TROPONINI <0.03 <0.03 <0.03   ------------------------------------------------------------------------------------------------------------------ Invalid input(s): POCBNP     Time Spent in minutes   No charge   Randol KernELGERGAWY, Sophee Mckimmy M.D on 03/06/2015 at 12:03 PM  Between 7am to 7pm - Pager - [phone]  After 7pm go to [email or web address] - password [email or web address]  [facility]   Office  [phone]

## 2015-03-06 NOTE — Progress Notes (Signed)
I have seen patient. Complete note to follow.  I have ordered 2D Echo and nuclear stres study for today.  Jerral BonitoJeff Ott Zimmerle, MD

## 2015-03-07 ENCOUNTER — Observation Stay (HOSPITAL_BASED_OUTPATIENT_CLINIC_OR_DEPARTMENT_OTHER): Payer: Commercial Managed Care - HMO

## 2015-03-07 DIAGNOSIS — F3132 Bipolar disorder, current episode depressed, moderate: Secondary | ICD-10-CM | POA: Diagnosis not present

## 2015-03-07 DIAGNOSIS — F4323 Adjustment disorder with mixed anxiety and depressed mood: Secondary | ICD-10-CM | POA: Diagnosis not present

## 2015-03-07 DIAGNOSIS — R079 Chest pain, unspecified: Secondary | ICD-10-CM | POA: Diagnosis not present

## 2015-03-07 DIAGNOSIS — Z8659 Personal history of other mental and behavioral disorders: Secondary | ICD-10-CM

## 2015-03-07 LAB — LIPID PANEL
CHOLESTEROL: 222 mg/dL — AB (ref 0–200)
HDL: 34 mg/dL — AB (ref >40)
LDL Cholesterol: 147 mg/dL — ABNORMAL HIGH (ref 0–99)
Total CHOL/HDL Ratio: 6.5 RATIO
Triglycerides: 204 mg/dL — ABNORMAL HIGH (ref <150)
VLDL: 41 mg/dL — ABNORMAL HIGH (ref 0–40)

## 2015-03-07 LAB — NM MYOCAR MULTI W/SPECT W/WALL MOTION / EF
CSEPEW: 1 METS
CSEPHR: 53 %
CSEPPHR: 96 {beats}/min
Exercise duration (min): 0 min
Exercise duration (sec): 0 s
LHR: 0.31
LV dias vol: 89 mL
LVSYSVOL: 25 mL
MPHR: 180 {beats}/min
NUC STRESS TID: 0.87
Rest HR: 75 {beats}/min
SDS: 1
SRS: 5
SSS: 6

## 2015-03-07 LAB — GLUCOSE, CAPILLARY: GLUCOSE-CAPILLARY: 98 mg/dL (ref 65–99)

## 2015-03-07 MED ORDER — HYDROCODONE-ACETAMINOPHEN 5-325 MG PO TABS: 1.0000 | ORAL_TABLET | Freq: Four times a day (QID) | ORAL | Status: DC | PRN

## 2015-03-07 MED ORDER — OXCARBAZEPINE 150 MG PO TABS
150.0000 mg | ORAL_TABLET | Freq: Two times a day (BID) | ORAL | Status: DC
  Filled 2015-03-07: qty 1

## 2015-03-07 MED ORDER — METOPROLOL SUCCINATE ER 50 MG PO TB24
50.0000 mg | ORAL_TABLET | Freq: Two times a day (BID) | ORAL | Status: DC
  Administered 2015-03-07: 50 mg via ORAL
  Filled 2015-03-07: qty 1

## 2015-03-07 MED ORDER — TECHNETIUM TC 99M SESTAMIBI GENERIC - CARDIOLITE
30.0000 | Freq: Once | INTRAVENOUS | Status: AC | PRN
  Administered 2015-03-07: 30 via INTRAVENOUS

## 2015-03-07 NOTE — Progress Notes (Signed)
Patient Name: Darrell Nunez Date of Encounter: 03/07/2015   SUBJECTIVE  Feeling well. No chest pain, sob or palpitations. Today is day 2 of 2 day myoview. Echo pending.   CURRENT MEDS . enoxaparin (LOVENOX) injection  40 mg Subcutaneous Q24H  . gabapentin  100 mg Oral TID  . losartan  100 mg Oral Daily  . metoprolol succinate  50 mg Oral Daily  . OXcarbazepine  150 mg Oral QHS  . ziprasidone  40 mg Oral QHS    OBJECTIVE  Filed Vitals:   03/06/15 0931 03/06/15 1300 03/06/15 2055 03/07/15 0500  BP: 126/93 143/84 167/84 131/74  Pulse: 82 73 84 71  Temp:  97.8 F (36.6 C) 98.4 F (36.9 C) 98.5 F (36.9 C)  TempSrc:  Oral Oral Oral  Resp:   16 16  Height:      Weight:    282 lb 14.4 oz (128.323 kg)  SpO2:  100% 98% 97%    Intake/Output Summary (Last 24 hours) at 03/07/15 0943 Last data filed at 03/07/15 0900  Gross per 24 hour  Intake    290 ml  Output      0 ml  Net    290 ml   Filed Weights   03/05/15 2006 03/06/15 0128 03/07/15 0500  Weight: 277 lb (125.646 kg) 283 lb (128.368 kg) 282 lb 14.4 oz (128.323 kg)    PHYSICAL EXAM  General: Pleasant, NAD. Neuro: Alert and oriented X 3. Moves all extremities spontaneously. Psych: Normal affect. HEENT:  Normal  Neck: Supple without bruits or JVD. Lungs:  Resp regular and unlabored, CTA. Heart: RRR no s3, s4, or murmurs. Abdomen: Soft, non-tender, non-distended, BS + x 4.  Extremities: No clubbing, cyanosis or edema. DP/PT/Radials 2+ and equal bilaterally.  Accessory Clinical Findings  CBC  Recent Labs  03/05/15 2020  WBC 13.1*  NEUTROABS 8.1*  HGB 15.8  HCT 44.3  MCV 83.1  PLT 295   Basic Metabolic Panel  Recent Labs  03/05/15 2020  NA 139  K 3.5  CL 106  CO2 26  GLUCOSE 148*  BUN 18  CREATININE 1.15  CALCIUM 9.1   Cardiac Enzymes  Recent Labs  03/05/15 2020 03/05/15 2340 03/06/15 0531  TROPONINI <0.03 <0.03 <0.03   TELE  NSR with PVCs  Radiology/Studies  Dg Chest 2  View  03/05/2015  CLINICAL DATA:  Central to LEFT side chest pain beginning at 1600 hours today, nausea, diaphoresis, history hypertension EXAM: CHEST  2 VIEW COMPARISON:  02/10/2014 FINDINGS: Normal heart size, mediastinal contours, and pulmonary vascularity. Lungs clear. No pleural effusion or pneumothorax. Bones unremarkable. IMPRESSION: Normal exam. Electronically Signed   By: Ulyses Southward M.D.   On: 03/05/2015 20:54    ASSESSMENT AND PLAN    1. Chest pain - His chest pain has both typical and atypical features. Sternal chest pressure/achyness associated with diaphoresis that lasted for approximately 4 hours. His pain improved after sublingual nitroglycerin x 3. Troponin x 3 negative. EKG without acute ST or T-wave abnormality. Telemetry without any arrhythmia. Chest x-ray without acute finding. - At presentation to ED his blood pressure was 182/120. His chest pain could be from uncontrolled hypertension. Most recent BP was 131/74. - He has a strong family history of cardiac problems. Today is day 2 of 2 day myoview. Echo pending.  - Continue BB and losartan - Will get A1c and lipid panel  2. HTN - Continue Toprol XL and losartan.  - relatively stable.   3.  Hyperglycemia - Will get HgbA1c  4. Bipolar affective disorder, depressed, moderate (HCC) - Per primary  5. morbid obesity - Estimated body mass index is 43.04 kg/(m^2) as calculated from the following:  Height as of this encounter: 5\' 8"  (1.727 m).  Weight as of this encounter: 283 lb (128.368 kg).  - Encouraged lifestyle change and daily exercise   Signed, Bhagat,Bhavinkumar PA-C   Patient seen and examined. Agree with assessment and plan. No recurrent chest pain. Rhythm stable.  Day 2 of 2 day protocol nuclear study. Getting echo now at bedside. Initial image suggest normal LV fxn; await additional image acquisition.   Lennette Biharihomas A. Carleigh Buccieri, MD, [facility] 03/07/2015 10:28 AM

## 2015-03-07 NOTE — Progress Notes (Signed)
  Echocardiogram 2D Echocardiogram has been performed.  Arvil ChacoFoster, Reda Gettis 03/07/2015, 10:59 AM

## 2015-03-07 NOTE — Care Management Note (Signed)
Case Management Note  Patient Details  Name: Angelina PihDarrick Legacy MRN: [id number] Date of Birth: [birth year]  Subjective/Objective: Pt admitted for chest pain. Pt is from home.                   Action/Plan: CM did speak with pt in regards to disposition needs/consult that CM did receive about medication needs. Per pt his psych medications are very expensive. Pt has insurance, however co pay is expensive. Pt has assistance via his grandmother to pay co pay. CM did suggest that pt may need to go through the company for patient assistance. CM did call the office @ [facility] @ [phone] to see if patient assistance could be started. CM did speak with Pharmacy Coordinator and awaiting call back. No further needs at this time.     Expected Discharge Date:                  Expected Discharge Plan:  Home/Self Care  In-House Referral:  NA  Discharge planning Services  CM Consult, Medication Assistance  Post Acute Care Choice:  NA Choice offered to:  NA  DME Arranged:  N/A DME Agency:  NA  HH Arranged:  NA HH Agency:  NA  Status of Service:     Medicare Important Message Given:    Date Medicare IM Given:    Medicare IM give by:    Date Additional Medicare IM Given:    Additional Medicare Important Message give by:     If discussed at Long Length of Stay Meetings, dates discussed:    Additional Comments:  Gala LewandowskyGraves-Bigelow, Weslyn Holsonback Kaye, RN 03/07/2015, 12:19 PM

## 2015-03-07 NOTE — Plan of Care (Signed)
Problem: Phase I Progression Outcomes Goal: Voiding-avoid urinary catheter unless indicated Outcome: Completed/Met Date Met:  03/07/15 Patient voids independent of urinary catheter.

## 2015-03-07 NOTE — Discharge Instructions (Signed)
Follow with SUN,VYVYAN Y, MD in 5-7 days ° °Please get a complete blood count and chemistry panel checked by your Primary MD at your next visit, and again as instructed by your Primary MD. Please get your medications reviewed and adjusted by your Primary MD. ° °Please request your Primary MD to go over all Hospital Tests and Procedure/Radiological results at the follow up, please get all Hospital records sent to your Prim MD by signing hospital release before you go home. ° °If you had Pneumonia of Lung problems at the Hospital: °Please get a 2 view Chest X ray done in 6-8 weeks after hospital discharge or sooner if instructed by your Primary MD. ° °If you have Congestive Heart Failure: °Please call your Cardiologist or Primary MD anytime you have any of the following symptoms:  °1) 3 pound weight gain in 24 hours or 5 pounds in 1 week  °2) shortness of breath, with or without a dry hacking cough  °3) swelling in the hands, feet or stomach  °4) if you have to sleep on extra pillows at night in order to breathe ° °Follow cardiac low salt diet and 1.5 lit/day fluid restriction. ° °If you have diabetes °Accuchecks 4 times/day, Once in AM empty stomach and then before each meal. °Log in all results and show them to your primary doctor at your next visit. °If any glucose reading is under 80 or above 300 call your primary MD immediately. ° °If you have Seizure/Convulsions/Epilepsy: °Please do not drive, operate heavy machinery, participate in activities at heights or participate in high speed sports until you have seen by Primary MD or a Neurologist and advised to do so again. ° °If you had Gastrointestinal Bleeding: °Please ask your Primary MD to check a complete blood count within one week of discharge or at your next visit. Your endoscopic/colonoscopic biopsies that are pending at the time of discharge, will also need to followed by your Primary MD. ° °Get Medicines reviewed and adjusted. °Please take all your  medications with you for your next visit with your Primary MD ° °Please request your Primary MD to go over all hospital tests and procedure/radiological results at the follow up, please ask your Primary MD to get all Hospital records sent to his/her office. ° °If you experience worsening of your admission symptoms, develop shortness of breath, life threatening emergency, suicidal or homicidal thoughts you must seek medical attention immediately by calling 911 or calling your MD immediately  if symptoms less severe. ° °You must read complete instructions/literature along with all the possible adverse reactions/side effects for all the Medicines you take and that have been prescribed to you. Take any new Medicines after you have completely understood and accpet all the possible adverse reactions/side effects.  ° °Do not drive or operate heavy machinery when taking Pain medications.  ° °Do not take more than prescribed Pain, Sleep and Anxiety Medications ° °Special Instructions: If you have smoked or chewed Tobacco  in the last 2 yrs please stop smoking, stop any regular Alcohol  and or any Recreational drug use. ° °Wear Seat belts while driving. ° °Please note °You were cared for by a hospitalist during your hospital stay. If you have any questions about your discharge medications or the care you received while you were in the hospital after you are discharged, you can call the unit and asked to speak with the hospitalist on call if the hospitalist that took care of you is not available. Once   you are discharged, your primary care physician will handle any further medical issues. Please note that NO REFILLS for any discharge medications will be authorized once you are discharged, as it is imperative that you return to your primary care physician (or establish a relationship with a primary care physician if you do not have one) for your aftercare needs so that they can reassess your need for medications and monitor your  lab values. ° °You can reach the hospitalist office at phone [phone] or fax [phone] °  °If you do not have a primary care physician, you can call [phone] for a physician referral. ° °Activity: As tolerated with Full fall precautions use walker/cane & assistance as needed ° °Diet: heart healthy ° °Disposition Home ° ° °

## 2015-03-08 LAB — HEMOGLOBIN A1C
HEMOGLOBIN A1C: 6 % — AB (ref 4.8–5.6)
MEAN PLASMA GLUCOSE: 126 mg/dL

## 2015-03-08 NOTE — Discharge Summary (Signed)
Physician Discharge Summary  Darrell Nunez ZOX:[id number] DOB: [birth year] DOA: 03/05/2015  PCP: Darrell Rubenstein, MD  Admit date: 03/05/2015 Discharge date: 03/08/2015  Time spent: < 30 minutes  Recommendations for Outpatient Follow-up:  1. Follow up with Dr. Wynelle Nunez in 2 weeks   Discharge Diagnoses:  Principal Problem:   Chest pain, unspecified Active Problems:   Bipolar affective disorder, depressed, moderate (HCC)   Adjustment disorder with mixed anxiety and depressed mood   H/O anxiety disorder   Morbid obesity (HCC)  Discharge Condition: stable  Diet recommendation: heart healthy  Filed Weights   03/05/15 2006 03/06/15 0128 03/07/15 0500  Weight: 125.646 kg (277 lb) 128.368 kg (283 lb) 128.323 kg (282 lb 14.4 oz)   History of present illness:  Darrell Nunez is a 39 y.o. male with h/o HTN, patient presents to the ED with c/o chest pain. 8/10 in intensity, waxing and waning symptoms. Pressure like sensation to mid sternum. Onset 4 hours PTA while out running errands. Associated nausea. No SOB, vomiting. Activity makes CP worse, laying down makes it better.  Hospital Course:  Patient was admitted to telemetry floor with chest pain, and given risk factors cardiology was consulted. He underwent a 2 day stress test which was normal without evidence of ischemia or infarction. Patient's chest pain resolved and he was discharged home in stable condition to follow up with his PCP post hospital stay. It is likely that his chest pain was MSK in nature and was given a short prescription for pain medications on d/c. He also underwent a 2D echo which showed normal EF without WMA (full report below)  Procedures:  2D echo Study Conclusions - Left ventricle: The cavity size was normal. Wall thickness wasnormal. Systolic function was normal. The estimated ejectionfraction was in the range of 55% to 60%. Wall motion was normal;there were no regional wall motion abnormalities.  Leftventricular diastolic function parameters were normal.   Consultations:  Cardiology   Discharge Exam: Filed Vitals:   03/06/15 1300 03/06/15 2055 03/07/15 0500 03/07/15 1450  BP: 143/84 167/84 131/74 155/92  Pulse: 73 84 71 78  Temp: 97.8 F (36.6 C) 98.4 F (36.9 C) 98.5 F (36.9 C) 98.6 F (37 C)  TempSrc: Oral Oral Oral Oral  Resp:  Height:      Weight:   128.323 kg (282 lb 14.4 oz)   SpO2: 100% 98% 97% 98%   General: NAD Cardiovascular: RRR Respiratory: CTA biL  Discharge Instructions     Medication List    TAKE these medications        acetaminophen 500 MG tablet  Commonly known as:  TYLENOL  Take 1,000 mg by mouth every 6 (six) hours as needed for mild pain.     ALPRAZolam 1 MG tablet  Commonly known as:  XANAX  Take 1 mg by mouth 4 (four) times daily as needed. Anxiety     aspirin-acetaminophen-caffeine 250-250-65 MG tablet  Commonly known as:  EXCEDRIN MIGRAINE  Take 1 tablet by mouth every 6 (six) hours as needed for pain (headache).     HYDROcodone-acetaminophen 5-325 MG tablet  Commonly known as:  NORCO  Take 1 tablet by mouth every 6 (six) hours as needed for moderate pain.     losartan 100 MG tablet  Commonly known as:  COZAAR  Take 100 mg by mouth daily.     metoprolol 50 MG tablet  Commonly known as:  LOPRESSOR  Take 50-100 mg by mouth 2 (two) times  daily. 100 mg in morning at 10 am 50 mg at 4-5 pm     NEURONTIN 100 MG capsule  Generic drug:  gabapentin  Take 100 mg by mouth 3 (three) times daily as needed (PANIC).     Oxcarbazepine 300 MG tablet  Commonly known as:  TRILEPTAL  Take 150 mg by mouth 2 (two) times daily.     ziprasidone 40 MG capsule  Commonly known as:  GEODON  Take 40 mg by mouth at bedtime.           Follow-up Information    Follow up with Darrell RubensteinSUN,Darrell Y, MD. Schedule an appointment as soon as possible for a visit in 2 weeks.   Specialty:  Family Medicine   Contact information:   [phone] [place] [address] [phone]       The results of significant diagnostics from this hospitalization (including imaging, microbiology, ancillary and laboratory) are listed below for reference.    Significant Diagnostic Studies: Dg Chest 2 View  03/05/2015  CLINICAL DATA:  Central to LEFT side chest pain beginning at 1600 hours today, nausea, diaphoresis, history hypertension EXAM: CHEST  2 VIEW COMPARISON:  02/10/2014 FINDINGS: Normal heart size, mediastinal contours, and pulmonary vascularity. Lungs clear. No pleural effusion or pneumothorax. Bones unremarkable. IMPRESSION: Normal exam. Electronically Signed   By: Darrell SouthwardMark  Nunez M.D.   On: 03/05/2015 20:54   Nm Myocar Multi W/spect W/wall Motion / Ef  03/07/2015   There was no ST segment deviation noted during stress.  The study is normal.  Nuclear stress EF: 72%.  The left ventricular ejection fraction is hyperdynamic (>65%).  Normal study, no evidence for ischemia or infarction.    Labs: Basic Metabolic Panel:  Recent Labs Lab 03/05/15 2020  NA 139  K 3.5  CL 106  CO2 26  GLUCOSE 148*  BUN 18  CREATININE 1.15  CALCIUM 9.1   CBC:  Recent Labs Lab 03/05/15 2020  WBC 13.1*  NEUTROABS 8.1*  HGB 15.8  HCT 44.3  MCV 83.1  PLT 295   Cardiac Enzymes:  Recent Labs Lab 03/05/15 2020 03/05/15 2340 03/06/15 0531  TROPONINI <0.03 <0.03 <0.03   CBG:  Recent Labs Lab 03/07/15 1117  GLUCAP 98    Signed:  Kolette Nunez  [facility] 03/08/2015, 10:37 AM

## 2015-03-21 DIAGNOSIS — I10 Essential (primary) hypertension: Secondary | ICD-10-CM | POA: Diagnosis not present

## 2015-03-21 DIAGNOSIS — F319 Bipolar disorder, unspecified: Secondary | ICD-10-CM | POA: Diagnosis not present

## 2015-03-21 DIAGNOSIS — Z6841 Body Mass Index (BMI) 40.0 and over, adult: Secondary | ICD-10-CM | POA: Diagnosis not present

## 2015-03-21 DIAGNOSIS — R079 Chest pain, unspecified: Secondary | ICD-10-CM | POA: Diagnosis not present

## 2015-03-21 DIAGNOSIS — Z1389 Encounter for screening for other disorder: Secondary | ICD-10-CM | POA: Diagnosis not present

## 2015-03-27 NOTE — Patient Outreach (Signed)
[facility] Care Management  03/27/2015  Brock Elkhatib 1976/03/09 [id number]   Referral from MD, assigned to George Inaavina Green, RN and [name] BadJoanna Saporito, LCSW for patient outreach.  Lilyann Gravelle L. Theresa Dohrman, AAS [facility] Care Management Assistant

## 2015-03-31 ENCOUNTER — Encounter: Payer: Self-pay | Admitting: *Deleted

## 2015-04-06 ENCOUNTER — Other Ambulatory Visit: Payer: Commercial Managed Care - HMO | Admitting: *Deleted

## 2015-04-07 NOTE — Patient Outreach (Signed)
[facility] Care Management  04/07/2015  Darrell Nunez November 07, 1975 [id number]   CSW made an initial attempt to try and contact patient today to perform phone assessment, as well as assess and assist with social needs and services, without success.  A HIPAA complaint message was left for patient on voicemail.  CSW is currently awaiting a return call.  [name] [name], BSW, MSW, LCSW  Licensed Restaurant manager, fast foodClinical Social Worker  [facility] Care Management [place] [facility]  Mailing [address]Address-[address], [place], [place] [address] Physical Address-[address], [place], [place] [address] Toll Free Main # [phone] Fax # [phone] Cell # [phone]  Fax # [phone]  [email or web address]

## 2015-04-09 ENCOUNTER — Other Ambulatory Visit: Payer: Self-pay

## 2015-04-09 NOTE — Patient Outreach (Addendum)
[facility] Care Management  04/09/2015  Darrell Nunez Jul 18, 1975 [id number]   Telephone call to patient regarding primary MD referral.  Unable to reach patient. HIPAA compliant voice message left with call back phone number.   ASSESSMENT: Primary MD referral  PLAN; RNCM will attempt 2nd telephone outreach to patient within 1 week.   George InaDavina Raphael Fitzpatrick RN,BSN,CCM [facility] Telephonic Care Coordinator [phone]

## 2015-04-11 ENCOUNTER — Other Ambulatory Visit: Payer: Commercial Managed Care - HMO | Admitting: *Deleted

## 2015-04-11 NOTE — Patient Outreach (Signed)
[facility]) Care Management  04/11/2015  Duilio Stabler 07/21/1975 [id number]   CSW made a second attempt to try and contact patient today to perform phone assessment, as well as assess and assist with social needs and services, without success.  A HIPAA complaint message was left for patient on voicemail.  CSW is currently awaiting a return call.  CSW has been requested to assist patient with counseling and supportive services, as patient currently suffers from relationship problems and has been diagnosed with Depression, Bipolar Disorder, Anxiety and Adjustment Disorder.  Patient has also been diagnosed with Pre-Diabetes, Hypertension and Morbid Obesity, just to name a few.  [name] BadJoanna Saporito, BSW, MSW, LCSW  Licensed Restaurant manager, fast foodClinical Social Worker  [facility] Care Management [place] [facility]  [place]ailing [place], [place] [address] Physical Address-[address], [place], [place] [address] Toll Free Main # [phone] Fax # [phone] Cell # [phone]  Fax # [phone]  [email or web address]

## 2015-04-16 ENCOUNTER — Other Ambulatory Visit: Payer: Commercial Managed Care - HMO

## 2015-04-16 NOTE — Patient Outreach (Signed)
[facility]) Care Management  04/16/2015  Shonta Pang 1975/11/21 [id number]   Second telephone call to patient regarding primary MD referral.  Unable to reach patient. HIPAA compliant voice message left with call back phone number.   PLAN: RNCM will attempt 3rd telephone outreach to patient within 3 business days.   George InaDavina Kiersten Coss RN,BSN,CCM [facility] Telephonic Care Coordinator [phone]

## 2015-04-19 ENCOUNTER — Other Ambulatory Visit: Payer: Commercial Managed Care - HMO | Admitting: *Deleted

## 2015-04-19 ENCOUNTER — Encounter: Payer: Self-pay | Admitting: *Deleted

## 2015-04-19 ENCOUNTER — Other Ambulatory Visit: Payer: Commercial Managed Care - HMO

## 2015-04-19 NOTE — Patient Outreach (Signed)
[facility]) Care Management  04/19/2015  Darrell Nunez 25-May-1975 [id number]   CSW made a third and final attempt to try and contact patient today to perform phone assessment, as well as assess and assist with social work needs and services, without success.  A HIPAA compliant message was left for patient on voicemail.  CSW continues to await a return call.  CSW will mail an outreach letter to patient's home, encouraging patient to contact CSW at his earliest convenience, if patient is interested in receiving social work services through CSW with [facility].  If CSW does not receive a return call from patient within the next 10 business days, CSW will proceed with case closure.  Required number of phone attempts will have been made and outreach letter mailed.    Darrell Nunez, Darrell Nunez, Darrell Nunez, Darrell Nunez  Licensed Restaurant manager, fast foodClinical Social Worker  [facility] Care Management [place] [facility]  Mailing [place]-[address], [place], [place] [address] Physical Address-[address], [place], [place] [address] Toll Free Main # [phone] Fax # [phone] Cell # [phone]  Fax # [phone]  [email or web address]

## 2015-04-19 NOTE — Patient Outreach (Signed)
[facility]) Care Management  04/19/2015  Darrell Nunez February 16, 1976 [id number]  Third telephone call to patient regarding primary MD referral. Unable to reach patient. HIPAA compliant voice message left with call back phone number.   PLAN: RNCM will send patient outreach letter to attempt contact.   George [name] RN,BSN,CCM [facility] Telephonic Care Coordinator [phone]

## 2015-05-03 ENCOUNTER — Other Ambulatory Visit: Payer: Commercial Managed Care - HMO | Admitting: *Deleted

## 2015-05-03 NOTE — Patient Outreach (Signed)
[facility]) Care Management  05/03/2015  Darrell Nunez 1975-11-05 [id number]  CSW made one last attempt to try and contact patient today to perform phone assessment, without success.  CSW will proceed with case closure on patient, as required phone attempts have been made and an outreach letter mailed to patient's home.  [name] [name], BSW, MSW, LCSW  Licensed Restaurant manager, fast foodClinical Social Worker  [facility] Care Management [place] [facility]  [place]ailing [place]-[address], [place], [place] [address] Physical Address-[address], [place], [place] [address] Toll Free Main # [phone] Fax # [phone] Cell # [phone]  Fax # [phone]  [email or web address]

## 2015-05-04 ENCOUNTER — Other Ambulatory Visit: Payer: Self-pay

## 2015-05-04 NOTE — Patient Outreach (Signed)
[facility]) Care Management  05/04/2015  Ebert Hustead 1975-06-10 [id number]   No response from patient after 3 telephone attempts and letter outreach.   PLAN; RNCM will refer patient to Damita Rhodie to close due to inability to establish contact. RNCM will notify patients primary MD of closure.   George InaDavina Hattye Siegfried RN,BSN,CCM [facility] Telephonic Care Coordinator [phone]

## 2015-05-25 DIAGNOSIS — H903 Sensorineural hearing loss, bilateral: Secondary | ICD-10-CM | POA: Diagnosis not present

## 2015-06-18 DIAGNOSIS — F319 Bipolar disorder, unspecified: Secondary | ICD-10-CM | POA: Diagnosis not present

## 2015-07-03 DIAGNOSIS — J069 Acute upper respiratory infection, unspecified: Secondary | ICD-10-CM | POA: Diagnosis not present

## 2015-07-26 DIAGNOSIS — H903 Sensorineural hearing loss, bilateral: Secondary | ICD-10-CM | POA: Diagnosis not present

## 2015-08-27 DIAGNOSIS — R05 Cough: Secondary | ICD-10-CM | POA: Diagnosis not present

## 2015-08-27 DIAGNOSIS — J029 Acute pharyngitis, unspecified: Secondary | ICD-10-CM | POA: Diagnosis not present

## 2015-08-27 DIAGNOSIS — J209 Acute bronchitis, unspecified: Secondary | ICD-10-CM | POA: Diagnosis not present

## 2015-08-27 DIAGNOSIS — B349 Viral infection, unspecified: Secondary | ICD-10-CM | POA: Diagnosis not present

## 2015-10-19 DIAGNOSIS — H903 Sensorineural hearing loss, bilateral: Secondary | ICD-10-CM | POA: Diagnosis not present

## 2015-10-23 DIAGNOSIS — F319 Bipolar disorder, unspecified: Secondary | ICD-10-CM | POA: Diagnosis not present

## 2015-10-23 DIAGNOSIS — F4001 Agoraphobia with panic disorder: Secondary | ICD-10-CM | POA: Diagnosis not present

## 2016-01-16 DIAGNOSIS — I10 Essential (primary) hypertension: Secondary | ICD-10-CM | POA: Diagnosis not present

## 2016-01-16 DIAGNOSIS — R7303 Prediabetes: Secondary | ICD-10-CM | POA: Diagnosis not present

## 2016-01-16 DIAGNOSIS — E7800 Pure hypercholesterolemia, unspecified: Secondary | ICD-10-CM | POA: Diagnosis not present

## 2016-01-16 DIAGNOSIS — F319 Bipolar disorder, unspecified: Secondary | ICD-10-CM | POA: Diagnosis not present

## 2016-01-16 DIAGNOSIS — R42 Dizziness and giddiness: Secondary | ICD-10-CM | POA: Diagnosis not present

## 2016-01-16 DIAGNOSIS — Z125 Encounter for screening for malignant neoplasm of prostate: Secondary | ICD-10-CM | POA: Diagnosis not present

## 2016-01-24 DIAGNOSIS — H903 Sensorineural hearing loss, bilateral: Secondary | ICD-10-CM | POA: Diagnosis not present

## 2016-02-06 DIAGNOSIS — Z0000 Encounter for general adult medical examination without abnormal findings: Secondary | ICD-10-CM | POA: Diagnosis not present

## 2016-02-06 DIAGNOSIS — R740 Nonspecific elevation of levels of transaminase and lactic acid dehydrogenase [LDH]: Secondary | ICD-10-CM | POA: Diagnosis not present

## 2016-02-06 DIAGNOSIS — R7303 Prediabetes: Secondary | ICD-10-CM | POA: Diagnosis not present

## 2016-02-06 DIAGNOSIS — I10 Essential (primary) hypertension: Secondary | ICD-10-CM | POA: Diagnosis not present

## 2016-02-06 DIAGNOSIS — E7800 Pure hypercholesterolemia, unspecified: Secondary | ICD-10-CM | POA: Diagnosis not present

## 2016-02-06 DIAGNOSIS — F319 Bipolar disorder, unspecified: Secondary | ICD-10-CM | POA: Diagnosis not present

## 2016-05-16 DIAGNOSIS — H539 Unspecified visual disturbance: Secondary | ICD-10-CM | POA: Diagnosis not present

## 2016-05-16 DIAGNOSIS — I10 Essential (primary) hypertension: Secondary | ICD-10-CM | POA: Diagnosis not present

## 2016-05-16 DIAGNOSIS — J0110 Acute frontal sinusitis, unspecified: Secondary | ICD-10-CM | POA: Diagnosis not present

## 2016-05-23 DIAGNOSIS — H903 Sensorineural hearing loss, bilateral: Secondary | ICD-10-CM | POA: Diagnosis not present

## 2016-06-09 ENCOUNTER — Other Ambulatory Visit (HOSPITAL_COMMUNITY): Payer: Self-pay | Admitting: Psychiatry

## 2016-06-17 ENCOUNTER — Other Ambulatory Visit (HOSPITAL_COMMUNITY): Payer: Self-pay | Admitting: Psychiatry

## 2016-07-29 DIAGNOSIS — F319 Bipolar disorder, unspecified: Secondary | ICD-10-CM | POA: Diagnosis not present

## 2016-07-29 DIAGNOSIS — F4001 Agoraphobia with panic disorder: Secondary | ICD-10-CM | POA: Diagnosis not present

## 2016-09-07 DIAGNOSIS — S96912A Strain of unspecified muscle and tendon at ankle and foot level, left foot, initial encounter: Secondary | ICD-10-CM | POA: Diagnosis not present

## 2016-12-03 DIAGNOSIS — Z45321 Encounter for adjustment and management of cochlear device: Secondary | ICD-10-CM | POA: Diagnosis not present

## 2016-12-03 DIAGNOSIS — H903 Sensorineural hearing loss, bilateral: Secondary | ICD-10-CM | POA: Diagnosis not present

## 2016-12-30 DIAGNOSIS — M25512 Pain in left shoulder: Secondary | ICD-10-CM | POA: Diagnosis not present

## 2016-12-30 DIAGNOSIS — I10 Essential (primary) hypertension: Secondary | ICD-10-CM | POA: Diagnosis not present

## 2016-12-30 DIAGNOSIS — M79602 Pain in left arm: Secondary | ICD-10-CM | POA: Diagnosis not present

## 2017-01-08 DIAGNOSIS — M25512 Pain in left shoulder: Secondary | ICD-10-CM | POA: Diagnosis not present

## 2017-01-26 DIAGNOSIS — F4001 Agoraphobia with panic disorder: Secondary | ICD-10-CM | POA: Diagnosis not present

## 2017-01-26 DIAGNOSIS — F319 Bipolar disorder, unspecified: Secondary | ICD-10-CM | POA: Diagnosis not present

## 2017-01-28 DIAGNOSIS — H903 Sensorineural hearing loss, bilateral: Secondary | ICD-10-CM | POA: Diagnosis not present

## 2017-02-27 DIAGNOSIS — H903 Sensorineural hearing loss, bilateral: Secondary | ICD-10-CM | POA: Diagnosis not present

## 2017-03-06 DIAGNOSIS — H903 Sensorineural hearing loss, bilateral: Secondary | ICD-10-CM | POA: Diagnosis not present

## 2017-03-13 DIAGNOSIS — H903 Sensorineural hearing loss, bilateral: Secondary | ICD-10-CM | POA: Diagnosis not present

## 2017-03-13 DIAGNOSIS — Z45321 Encounter for adjustment and management of cochlear device: Secondary | ICD-10-CM | POA: Diagnosis not present

## 2017-04-01 DIAGNOSIS — H903 Sensorineural hearing loss, bilateral: Secondary | ICD-10-CM | POA: Diagnosis not present

## 2017-04-30 DIAGNOSIS — R0789 Other chest pain: Secondary | ICD-10-CM | POA: Diagnosis not present

## 2017-04-30 DIAGNOSIS — Z6841 Body Mass Index (BMI) 40.0 and over, adult: Secondary | ICD-10-CM | POA: Diagnosis not present

## 2017-04-30 DIAGNOSIS — R29818 Other symptoms and signs involving the nervous system: Secondary | ICD-10-CM | POA: Diagnosis not present

## 2017-04-30 DIAGNOSIS — R42 Dizziness and giddiness: Secondary | ICD-10-CM | POA: Diagnosis not present

## 2017-04-30 DIAGNOSIS — R0683 Snoring: Secondary | ICD-10-CM | POA: Diagnosis not present

## 2017-04-30 DIAGNOSIS — M25512 Pain in left shoulder: Secondary | ICD-10-CM | POA: Diagnosis not present

## 2017-05-07 DIAGNOSIS — H903 Sensorineural hearing loss, bilateral: Secondary | ICD-10-CM | POA: Diagnosis not present

## 2017-06-18 DIAGNOSIS — H903 Sensorineural hearing loss, bilateral: Secondary | ICD-10-CM | POA: Diagnosis not present

## 2017-07-07 DIAGNOSIS — F319 Bipolar disorder, unspecified: Secondary | ICD-10-CM | POA: Diagnosis not present

## 2017-07-07 DIAGNOSIS — F4001 Agoraphobia with panic disorder: Secondary | ICD-10-CM | POA: Diagnosis not present

## 2017-07-28 DIAGNOSIS — M25512 Pain in left shoulder: Secondary | ICD-10-CM | POA: Diagnosis not present

## 2017-07-31 ENCOUNTER — Other Ambulatory Visit: Payer: Self-pay | Admitting: Orthopedic Surgery

## 2017-07-31 DIAGNOSIS — M25512 Pain in left shoulder: Secondary | ICD-10-CM

## 2017-08-01 DIAGNOSIS — H903 Sensorineural hearing loss, bilateral: Secondary | ICD-10-CM | POA: Diagnosis not present

## 2017-08-10 ENCOUNTER — Encounter (HOSPITAL_COMMUNITY): Payer: Self-pay | Admitting: Emergency Medicine

## 2017-08-10 ENCOUNTER — Emergency Department (HOSPITAL_COMMUNITY)
Admission: EM | Admit: 2017-08-10 | Discharge: 2017-08-10 | Disposition: A | Discharge status: Home / Self Care | Payer: Medicare HMO | Attending: Emergency Medicine | Admitting: Emergency Medicine

## 2017-08-10 DIAGNOSIS — M25512 Pain in left shoulder: Secondary | ICD-10-CM | POA: Diagnosis not present

## 2017-08-10 DIAGNOSIS — G8929 Other chronic pain: Secondary | ICD-10-CM | POA: Diagnosis not present

## 2017-08-10 DIAGNOSIS — I10 Essential (primary) hypertension: Secondary | ICD-10-CM | POA: Diagnosis not present

## 2017-08-10 DIAGNOSIS — Z79899 Other long term (current) drug therapy: Secondary | ICD-10-CM | POA: Insufficient documentation

## 2017-08-10 MED ORDER — CYCLOBENZAPRINE HCL 10 MG PO TABS: 10.0000 mg | ORAL_TABLET | Freq: Two times a day (BID) | ORAL | 0 refills | Status: DC | PRN

## 2017-08-10 MED ORDER — OXYCODONE-ACETAMINOPHEN 5-325 MG PO TABS
1.0000 | ORAL_TABLET | Freq: Once | ORAL | Status: AC
  Administered 2017-08-10: 1 via ORAL
  Filled 2017-08-10: qty 1

## 2017-08-10 MED ORDER — MELOXICAM 7.5 MG PO TABS: 7.5000 mg | ORAL_TABLET | Freq: Every day | ORAL | 0 refills | Status: DC

## 2017-08-10 NOTE — ED Triage Notes (Signed)
Patient c/o left shoulder pain since injury last August. Reports he is a patient of [facility] where he was dx with "a frayed rotator cuff." Reports he refused steroid injection at orthopedist and followed up with PCP where he got prescription for tramadol. States pain has increased since January. States pain is relieved in sling. Patient has sling in place at this time.

## 2017-08-10 NOTE — Discharge Instructions (Signed)
It was my pleasure taking care of you today!   Mobic is your anti-inflammatory medication to take daily.  This is best taken with food to prevent stomach upset.  Flexeril is your muscle relaxer to take twice daily as needed.  Call your orthopedist first thing tomorrow morning to inquire about further pain medication regimen.  Return to ER for new or worsening symptoms, any additional concerns.

## 2017-08-10 NOTE — ED Provider Notes (Signed)
[facility]-EMERGENCY DEPT Provider Note   CSN: [id number] Arrival date & time: 08/10/17  1801     History   Chief Complaint Chief Complaint  Patient presents with  . Shoulder Pain    HPI Darrell Nunez is a 42 y.o. male.  The history is provided by the patient and medical records. No language interpreter was used.  Shoulder Pain   Pertinent negatives include no numbness.   Darrell Nunez is a 42 y.o. male  with a PMH as listed below who presents to the Emergency Department complaining of left shoulder pain.  Patient states that his shoulder actually began hurting around August or September 2018.  He saw orthopedist initially who offered a cortisone shot, but he declined at that time.  He was told to follow-up with them if he changed his mind.  He saw orthopedist again 2 weeks ago, who no longer offered the shot for him.  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  He called his primary care doctor who filled a prescription for tramadol for him on 3/22.  He has been taking this, but it is not helping very much.  He tried to call orthopedist, but they stated they would be out of the office until Tuesday.  Denies any numbness, tingling.  Pain is worse with certain movements like lifting arm above his head.  Past Medical History:  Diagnosis Date  . Anxiety   . Bipolar disorder (HCC)   . Depression   . Hearing impairment   . Hypertension   . Panic attacks     Patient Active Problem List   Diagnosis Date Noted  . Morbid obesity (HCC) 03/08/2015  . Chest pain, unspecified 03/05/2015  . Bipolar affective disorder, depressed, moderate (HCC) 02/26/2013  . Adjustment disorder with mixed anxiety and depressed mood 02/26/2013  . Marital or partner relational problem 02/26/2013  . H/O anxiety disorder 02/26/2013    Past Surgical History:  Procedure  Laterality Date  . INNER EAR SURGERY          Home Medications    Prior to Admission medications   Medication Sig Start Date End Date Taking? Authorizing Provider  acetaminophen (TYLENOL) 500 MG tablet Take 1,000 mg by mouth every 6 (six) hours as needed for mild pain.    [provider]  ALPRAZolam Prudy Feeler(XANAX) 1 MG tablet Take 1 mg by mouth 4 (four) times daily as needed. Anxiety    [provider]  aspirin-acetaminophen-caffeine (EXCEDRIN MIGRAINE) 269 340 [id number] MG per tablet Take 1 tablet by mouth every 6 (six) hours as needed for pain (headache).    [provider]  cyclobenzaprine (FLEXERIL) 10 MG tablet Take 1 tablet (10 mg total) by mouth 2 (two) times daily as needed (muscle soreness). 08/10/17   Kassidee Narciso, Chase PicketJaime Pilcher, PA-C  gabapentin (NEURONTIN) 100 MG capsule Take 100 mg by mouth 3 (three) times daily as needed (PANIC).     [provider]  HYDROcodone-acetaminophen (NORCO) 5-325 MG tablet Take 1 tablet by mouth every 6 (six) hours as needed for moderate pain. 03/07/15   Leatha GildingGherghe, Costin M, MD  losartan (COZAAR) 100 MG tablet Take 100 mg by mouth daily.    [provider]  meloxicam (MOBIC) 7.5 MG tablet Take 1 tablet (7.5 mg total) by mouth daily. 08/10/17   Jasun Gasparini, Chase PicketJaime Pilcher, PA-C  metoprolol (LOPRESSOR) 50 MG tablet Take 50-100 mg  by mouth 2 (two) times daily. 100 mg in morning at 10 am 50 mg at 4-5 pm    [provider]  Oxcarbazepine (TRILEPTAL) 300 MG tablet Take 150 mg by mouth 2 (two) times daily.     [provider]  ziprasidone (GEODON) 40 MG capsule Take 40 mg by mouth at bedtime.    [provider]    Family History Family History  Problem Relation Age of Onset  . Other Mother        factor five leiden  . Heart attack Maternal Grandfather   . Diabetes Maternal Grandfather   . Diabetes Maternal Grandmother     Social History Social History   Tobacco Use  . Smoking status: Never Smoker  .  Smokeless tobacco: Never Used  Substance Use Topics  . Alcohol use: No  . Drug use: No     Allergies   Depakote [divalproex sodium] and Lamictal [lamotrigine]   Review of Systems Review of Systems  Musculoskeletal: Positive for arthralgias and myalgias. Negative for neck pain.  Skin: Negative for wound.  Neurological: Negative for weakness and numbness.     Physical Exam Updated Vital Signs BP (!) 154/102 (BP Location: Left Arm)   Pulse 81   Temp 98.7 F (37.1 C) (Oral)   Resp 18   SpO2 96%   Physical Exam  Constitutional: He appears well-developed and well-nourished. No distress.  HENT:  Head: Normocephalic and atraumatic.  Neck: Neck supple.  Cardiovascular: Normal rate, regular rhythm and normal heart sounds.  No murmur heard. Pulmonary/Chest: Effort normal and breath sounds normal. No respiratory distress. He has no wheezes. He has no rales.  Musculoskeletal:  Diffuse tenderness to the anterior and lateral shoulder.  Decreased range of motion secondary to pain.  2+ radial pulse.  Sensation intact.  All compartments soft.  Neurological: He is alert.  Skin: Skin is warm and dry.  Nursing note and vitals reviewed.    ED Treatments / Results  Labs (all labs ordered are listed, but only abnormal results are displayed) Labs Reviewed - No data to display  EKG None  Radiology No results found.  Procedures Procedures (including critical care time)  Medications Ordered in ED Medications  oxyCODONE-acetaminophen (PERCOCET/ROXICET) 5-325 MG per tablet 1 tablet (has no administration in time range)     Initial Impression / Assessment and Plan / ED Course  I have reviewed the triage vital signs and the nursing notes.  Pertinent labs & imaging results that were available during my care of the patient were reviewed by me and considered in my medical decision making (see chart for details).    Darrell Nunez is a 42 y.o. male who presents to ED for chronic  left shoulder pain followed by orthopedist.  He is also seen his PCP who wrote a prescription for 5-day supply of tramadol on 3/22.  Too early to fill any further narcotics at this time.  He is not on any anti-inflammatories or muscle relaxants.  Will start on Mobic and Flexeril.  Encouraged him to follow-up with his orthopedist for any further pain medication needs for his shoulder pain.  Reasons to return to the ER were discussed and all questions answered.   Final Clinical Impressions(s) / ED Diagnoses   Final diagnoses:  Chronic left shoulder pain    ED Discharge Orders        Ordered    meloxicam (MOBIC) 7.5 MG tablet  Daily     08/10/17 2006  cyclobenzaprine (FLEXERIL) 10 MG tablet  2 times daily PRN     08/10/17 2006       Galo Sayed, Chase Picket, PA-C 08/10/17 2016    Rolan Bucco, MD 08/11/17 0005

## 2017-08-17 ENCOUNTER — Ambulatory Visit
Admission: RE | Admit: 2017-08-17 | Discharge: 2017-08-17 | Disposition: A | Discharge status: Home / Self Care | Payer: Commercial Managed Care - HMO | Source: Ambulatory Visit | Attending: Orthopedic Surgery | Admitting: Orthopedic Surgery

## 2017-08-17 DIAGNOSIS — M25512 Pain in left shoulder: Secondary | ICD-10-CM | POA: Diagnosis not present

## 2017-08-17 DIAGNOSIS — M19012 Primary osteoarthritis, left shoulder: Secondary | ICD-10-CM | POA: Diagnosis not present

## 2017-08-17 MED ORDER — IOPAMIDOL (ISOVUE-M 200) INJECTION 41%
10.0000 mL | Freq: Once | INTRAMUSCULAR | Status: AC
  Administered 2017-08-17: 10 mL via INTRA_ARTICULAR

## 2017-08-27 DIAGNOSIS — M25512 Pain in left shoulder: Secondary | ICD-10-CM | POA: Diagnosis not present

## 2017-11-09 DIAGNOSIS — R0981 Nasal congestion: Secondary | ICD-10-CM | POA: Diagnosis not present

## 2017-11-09 DIAGNOSIS — I10 Essential (primary) hypertension: Secondary | ICD-10-CM | POA: Diagnosis not present

## 2017-11-09 DIAGNOSIS — R05 Cough: Secondary | ICD-10-CM | POA: Diagnosis not present

## 2018-01-04 DIAGNOSIS — F319 Bipolar disorder, unspecified: Secondary | ICD-10-CM | POA: Diagnosis not present

## 2018-01-04 DIAGNOSIS — F4001 Agoraphobia with panic disorder: Secondary | ICD-10-CM | POA: Diagnosis not present

## 2018-01-08 DIAGNOSIS — Z713 Dietary counseling and surveillance: Secondary | ICD-10-CM | POA: Diagnosis not present

## 2018-01-08 DIAGNOSIS — E7800 Pure hypercholesterolemia, unspecified: Secondary | ICD-10-CM | POA: Diagnosis not present

## 2018-01-08 DIAGNOSIS — R7303 Prediabetes: Secondary | ICD-10-CM | POA: Diagnosis not present

## 2018-01-08 DIAGNOSIS — Z6841 Body Mass Index (BMI) 40.0 and over, adult: Secondary | ICD-10-CM | POA: Diagnosis not present

## 2018-01-08 DIAGNOSIS — I10 Essential (primary) hypertension: Secondary | ICD-10-CM | POA: Diagnosis not present

## 2018-06-24 DIAGNOSIS — F319 Bipolar disorder, unspecified: Secondary | ICD-10-CM | POA: Diagnosis not present

## 2018-06-24 DIAGNOSIS — F4001 Agoraphobia with panic disorder: Secondary | ICD-10-CM | POA: Diagnosis not present

## 2018-09-16 DIAGNOSIS — E7800 Pure hypercholesterolemia, unspecified: Secondary | ICD-10-CM | POA: Diagnosis not present

## 2018-09-16 DIAGNOSIS — I10 Essential (primary) hypertension: Secondary | ICD-10-CM | POA: Diagnosis not present

## 2018-09-16 DIAGNOSIS — E1169 Type 2 diabetes mellitus with other specified complication: Secondary | ICD-10-CM | POA: Diagnosis not present

## 2018-09-16 DIAGNOSIS — F319 Bipolar disorder, unspecified: Secondary | ICD-10-CM | POA: Diagnosis not present

## 2018-10-08 DIAGNOSIS — F4001 Agoraphobia with panic disorder: Secondary | ICD-10-CM | POA: Diagnosis not present

## 2018-10-08 DIAGNOSIS — F319 Bipolar disorder, unspecified: Secondary | ICD-10-CM | POA: Diagnosis not present

## 2018-11-03 DIAGNOSIS — R05 Cough: Secondary | ICD-10-CM | POA: Diagnosis not present

## 2018-11-03 DIAGNOSIS — Z20828 Contact with and (suspected) exposure to other viral communicable diseases: Secondary | ICD-10-CM | POA: Diagnosis not present

## 2019-03-04 DIAGNOSIS — F319 Bipolar disorder, unspecified: Secondary | ICD-10-CM | POA: Diagnosis not present

## 2019-03-09 DIAGNOSIS — G4719 Other hypersomnia: Secondary | ICD-10-CM | POA: Diagnosis not present

## 2019-03-09 DIAGNOSIS — E1169 Type 2 diabetes mellitus with other specified complication: Secondary | ICD-10-CM | POA: Diagnosis not present

## 2019-03-09 DIAGNOSIS — E7800 Pure hypercholesterolemia, unspecified: Secondary | ICD-10-CM | POA: Diagnosis not present

## 2019-03-09 DIAGNOSIS — I10 Essential (primary) hypertension: Secondary | ICD-10-CM | POA: Diagnosis not present

## 2019-03-09 DIAGNOSIS — F4001 Agoraphobia with panic disorder: Secondary | ICD-10-CM | POA: Diagnosis not present

## 2019-03-09 DIAGNOSIS — F319 Bipolar disorder, unspecified: Secondary | ICD-10-CM | POA: Diagnosis not present

## 2019-03-09 DIAGNOSIS — R5383 Other fatigue: Secondary | ICD-10-CM | POA: Diagnosis not present

## 2019-03-17 DIAGNOSIS — J0100 Acute maxillary sinusitis, unspecified: Secondary | ICD-10-CM | POA: Diagnosis not present

## 2019-03-21 DIAGNOSIS — Z20828 Contact with and (suspected) exposure to other viral communicable diseases: Secondary | ICD-10-CM | POA: Diagnosis not present

## 2019-07-05 DIAGNOSIS — F319 Bipolar disorder, unspecified: Secondary | ICD-10-CM | POA: Diagnosis not present

## 2019-07-05 DIAGNOSIS — F4001 Agoraphobia with panic disorder: Secondary | ICD-10-CM | POA: Diagnosis not present

## 2019-07-29 DIAGNOSIS — I10 Essential (primary) hypertension: Secondary | ICD-10-CM | POA: Diagnosis not present

## 2019-07-29 DIAGNOSIS — E1169 Type 2 diabetes mellitus with other specified complication: Secondary | ICD-10-CM | POA: Diagnosis not present

## 2019-07-29 DIAGNOSIS — G4719 Other hypersomnia: Secondary | ICD-10-CM | POA: Diagnosis not present

## 2019-07-29 DIAGNOSIS — F319 Bipolar disorder, unspecified: Secondary | ICD-10-CM | POA: Diagnosis not present

## 2019-07-29 DIAGNOSIS — E7800 Pure hypercholesterolemia, unspecified: Secondary | ICD-10-CM | POA: Diagnosis not present

## 2019-07-29 DIAGNOSIS — F4001 Agoraphobia with panic disorder: Secondary | ICD-10-CM | POA: Diagnosis not present

## 2019-07-29 DIAGNOSIS — Z9621 Cochlear implant status: Secondary | ICD-10-CM | POA: Diagnosis not present

## 2019-08-03 DIAGNOSIS — E7800 Pure hypercholesterolemia, unspecified: Secondary | ICD-10-CM | POA: Diagnosis not present

## 2019-08-03 DIAGNOSIS — I10 Essential (primary) hypertension: Secondary | ICD-10-CM | POA: Diagnosis not present

## 2019-08-03 DIAGNOSIS — E1169 Type 2 diabetes mellitus with other specified complication: Secondary | ICD-10-CM | POA: Diagnosis not present

## 2019-08-03 DIAGNOSIS — F319 Bipolar disorder, unspecified: Secondary | ICD-10-CM | POA: Diagnosis not present

## 2019-08-17 DIAGNOSIS — E119 Type 2 diabetes mellitus without complications: Secondary | ICD-10-CM | POA: Diagnosis not present

## 2019-08-17 DIAGNOSIS — I10 Essential (primary) hypertension: Secondary | ICD-10-CM | POA: Diagnosis not present

## 2019-08-17 DIAGNOSIS — E7800 Pure hypercholesterolemia, unspecified: Secondary | ICD-10-CM | POA: Diagnosis not present

## 2019-08-17 DIAGNOSIS — E1169 Type 2 diabetes mellitus with other specified complication: Secondary | ICD-10-CM | POA: Diagnosis not present

## 2019-10-01 DIAGNOSIS — L02415 Cutaneous abscess of right lower limb: Secondary | ICD-10-CM | POA: Diagnosis not present

## 2019-10-13 DIAGNOSIS — I10 Essential (primary) hypertension: Secondary | ICD-10-CM | POA: Diagnosis not present

## 2019-10-13 DIAGNOSIS — E1169 Type 2 diabetes mellitus with other specified complication: Secondary | ICD-10-CM | POA: Diagnosis not present

## 2019-10-13 DIAGNOSIS — E7800 Pure hypercholesterolemia, unspecified: Secondary | ICD-10-CM | POA: Diagnosis not present

## 2019-12-19 DIAGNOSIS — E7800 Pure hypercholesterolemia, unspecified: Secondary | ICD-10-CM | POA: Diagnosis not present

## 2019-12-19 DIAGNOSIS — K1379 Other lesions of oral mucosa: Secondary | ICD-10-CM | POA: Diagnosis not present

## 2019-12-19 DIAGNOSIS — I10 Essential (primary) hypertension: Secondary | ICD-10-CM | POA: Diagnosis not present

## 2019-12-19 DIAGNOSIS — E1169 Type 2 diabetes mellitus with other specified complication: Secondary | ICD-10-CM | POA: Diagnosis not present

## 2019-12-19 DIAGNOSIS — K047 Periapical abscess without sinus: Secondary | ICD-10-CM | POA: Diagnosis not present

## 2019-12-28 DIAGNOSIS — F319 Bipolar disorder, unspecified: Secondary | ICD-10-CM | POA: Diagnosis not present

## 2019-12-28 DIAGNOSIS — F4001 Agoraphobia with panic disorder: Secondary | ICD-10-CM | POA: Diagnosis not present

## 2020-01-25 DIAGNOSIS — E1169 Type 2 diabetes mellitus with other specified complication: Secondary | ICD-10-CM | POA: Diagnosis not present

## 2020-01-25 DIAGNOSIS — K1379 Other lesions of oral mucosa: Secondary | ICD-10-CM | POA: Diagnosis not present

## 2020-01-25 DIAGNOSIS — E7800 Pure hypercholesterolemia, unspecified: Secondary | ICD-10-CM | POA: Diagnosis not present

## 2020-01-25 DIAGNOSIS — I10 Essential (primary) hypertension: Secondary | ICD-10-CM | POA: Diagnosis not present

## 2020-06-07 DIAGNOSIS — M25512 Pain in left shoulder: Secondary | ICD-10-CM | POA: Diagnosis not present

## 2020-07-04 DIAGNOSIS — F4001 Agoraphobia with panic disorder: Secondary | ICD-10-CM | POA: Diagnosis not present

## 2020-07-04 DIAGNOSIS — F319 Bipolar disorder, unspecified: Secondary | ICD-10-CM | POA: Diagnosis not present

## 2020-09-27 DIAGNOSIS — K0889 Other specified disorders of teeth and supporting structures: Secondary | ICD-10-CM | POA: Diagnosis not present

## 2020-09-27 DIAGNOSIS — S0993XA Unspecified injury of face, initial encounter: Secondary | ICD-10-CM | POA: Diagnosis not present

## 2020-09-27 DIAGNOSIS — I10 Essential (primary) hypertension: Secondary | ICD-10-CM | POA: Diagnosis not present

## 2020-09-27 DIAGNOSIS — K0510 Chronic gingivitis, plaque induced: Secondary | ICD-10-CM | POA: Diagnosis not present

## 2020-09-27 DIAGNOSIS — W208XXA Other cause of strike by thrown, projected or falling object, initial encounter: Secondary | ICD-10-CM | POA: Diagnosis not present

## 2020-12-31 DIAGNOSIS — F319 Bipolar disorder, unspecified: Secondary | ICD-10-CM | POA: Diagnosis not present

## 2020-12-31 DIAGNOSIS — F4001 Agoraphobia with panic disorder: Secondary | ICD-10-CM | POA: Diagnosis not present

## 2021-01-03 DIAGNOSIS — Z7984 Long term (current) use of oral hypoglycemic drugs: Secondary | ICD-10-CM | POA: Diagnosis not present

## 2021-01-03 DIAGNOSIS — I10 Essential (primary) hypertension: Secondary | ICD-10-CM | POA: Diagnosis not present

## 2021-01-03 DIAGNOSIS — E7800 Pure hypercholesterolemia, unspecified: Secondary | ICD-10-CM | POA: Diagnosis not present

## 2021-01-03 DIAGNOSIS — E1169 Type 2 diabetes mellitus with other specified complication: Secondary | ICD-10-CM | POA: Diagnosis not present

## 2021-02-19 DIAGNOSIS — K047 Periapical abscess without sinus: Secondary | ICD-10-CM | POA: Diagnosis not present

## 2021-02-19 DIAGNOSIS — U071 COVID-19: Secondary | ICD-10-CM | POA: Diagnosis not present

## 2021-02-25 DIAGNOSIS — B349 Viral infection, unspecified: Secondary | ICD-10-CM | POA: Diagnosis not present

## 2021-04-30 DIAGNOSIS — R0681 Apnea, not elsewhere classified: Secondary | ICD-10-CM | POA: Diagnosis not present

## 2021-05-14 DIAGNOSIS — J069 Acute upper respiratory infection, unspecified: Secondary | ICD-10-CM | POA: Diagnosis not present

## 2021-06-10 DIAGNOSIS — G4733 Obstructive sleep apnea (adult) (pediatric): Secondary | ICD-10-CM | POA: Diagnosis not present

## 2021-06-19 DIAGNOSIS — F319 Bipolar disorder, unspecified: Secondary | ICD-10-CM | POA: Diagnosis not present

## 2021-06-19 DIAGNOSIS — F4001 Agoraphobia with panic disorder: Secondary | ICD-10-CM | POA: Diagnosis not present

## 2021-07-27 DIAGNOSIS — R1031 Right lower quadrant pain: Secondary | ICD-10-CM | POA: Diagnosis not present

## 2021-07-27 DIAGNOSIS — R1032 Left lower quadrant pain: Secondary | ICD-10-CM | POA: Diagnosis not present

## 2021-07-27 DIAGNOSIS — R1030 Lower abdominal pain, unspecified: Secondary | ICD-10-CM | POA: Diagnosis not present

## 2021-08-09 ENCOUNTER — Encounter (HOSPITAL_BASED_OUTPATIENT_CLINIC_OR_DEPARTMENT_OTHER): Payer: Self-pay | Admitting: Obstetrics and Gynecology

## 2021-08-09 ENCOUNTER — Other Ambulatory Visit: Payer: Self-pay

## 2021-08-09 ENCOUNTER — Emergency Department (HOSPITAL_BASED_OUTPATIENT_CLINIC_OR_DEPARTMENT_OTHER): Payer: Medicare HMO

## 2021-08-09 ENCOUNTER — Emergency Department (HOSPITAL_BASED_OUTPATIENT_CLINIC_OR_DEPARTMENT_OTHER)
Admission: EM | Admit: 2021-08-09 | Discharge: 2021-08-09 | Disposition: A | Discharge status: Home / Self Care | Payer: Medicare HMO | Attending: Emergency Medicine | Admitting: Emergency Medicine

## 2021-08-09 DIAGNOSIS — Z79899 Other long term (current) drug therapy: Secondary | ICD-10-CM | POA: Insufficient documentation

## 2021-08-09 DIAGNOSIS — E876 Hypokalemia: Secondary | ICD-10-CM | POA: Diagnosis not present

## 2021-08-09 DIAGNOSIS — R1030 Lower abdominal pain, unspecified: Secondary | ICD-10-CM | POA: Diagnosis present

## 2021-08-09 DIAGNOSIS — K859 Acute pancreatitis without necrosis or infection, unspecified: Secondary | ICD-10-CM | POA: Insufficient documentation

## 2021-08-09 DIAGNOSIS — K4020 Bilateral inguinal hernia, without obstruction or gangrene, not specified as recurrent: Secondary | ICD-10-CM | POA: Diagnosis not present

## 2021-08-09 DIAGNOSIS — K4021 Bilateral inguinal hernia, without obstruction or gangrene, recurrent: Secondary | ICD-10-CM | POA: Diagnosis not present

## 2021-08-09 DIAGNOSIS — Z7982 Long term (current) use of aspirin: Secondary | ICD-10-CM | POA: Diagnosis not present

## 2021-08-09 DIAGNOSIS — R109 Unspecified abdominal pain: Secondary | ICD-10-CM | POA: Diagnosis not present

## 2021-08-09 LAB — URINALYSIS, ROUTINE W REFLEX MICROSCOPIC
Bilirubin Urine: NEGATIVE
Glucose, UA: 250 mg/dL — AB
Hgb urine dipstick: NEGATIVE
Ketones, ur: NEGATIVE mg/dL
Leukocytes,Ua: NEGATIVE
Nitrite: NEGATIVE
Protein, ur: 30 mg/dL — AB
Specific Gravity, Urine: 1.027 (ref 1.005–1.030)
pH: 5.5 (ref 5.0–8.0)

## 2021-08-09 LAB — COMPREHENSIVE METABOLIC PANEL
ALT: 47 U/L — ABNORMAL HIGH (ref 0–44)
AST: 37 U/L (ref 15–41)
Albumin: 4.7 g/dL (ref 3.5–5.0)
Alkaline Phosphatase: 75 U/L (ref 38–126)
Anion gap: 11 (ref 5–15)
BUN: 9 mg/dL (ref 6–20)
CO2: 27 mmol/L (ref 22–32)
Calcium: 9.2 mg/dL (ref 8.9–10.3)
Chloride: 101 mmol/L (ref 98–111)
Creatinine, Ser: 1.06 mg/dL (ref 0.61–1.24)
GFR, Estimated: >60 mL/min (ref >60)
Glucose, Bld: 217 mg/dL — ABNORMAL HIGH (ref 70–99)
Potassium: 3 mmol/L — ABNORMAL LOW (ref 3.5–5.1)
Sodium: 139 mmol/L (ref 135–145)
Total Bilirubin: 0.4 mg/dL (ref 0.3–1.2)
Total Protein: 7.5 g/dL (ref 6.5–8.1)

## 2021-08-09 LAB — CBC
HCT: 42.2 % (ref 39.0–52.0)
Hemoglobin: 14.8 g/dL (ref 13.0–17.0)
MCH: 28.8 pg (ref 26.0–34.0)
MCHC: 35.1 g/dL (ref 30.0–36.0)
MCV: 82.3 fL (ref 80.0–100.0)
Platelets: 287 10*3/uL (ref 150–400)
RBC: 5.13 MIL/uL (ref 4.22–5.81)
RDW: 13.6 % (ref 11.5–15.5)
WBC: 10.6 10*3/uL — ABNORMAL HIGH (ref 4.0–10.5)
nRBC: 0 % (ref 0.0–0.2)

## 2021-08-09 LAB — LIPASE, BLOOD: Lipase: 280 U/L — ABNORMAL HIGH (ref 11–51)

## 2021-08-09 MED ORDER — POTASSIUM CHLORIDE CRYS ER 20 MEQ PO TBCR
40.0000 meq | EXTENDED_RELEASE_TABLET | Freq: Once | ORAL | Status: AC
  Administered 2021-08-09: 40 meq via ORAL
  Filled 2021-08-09: qty 2

## 2021-08-09 MED ORDER — POTASSIUM CHLORIDE CRYS ER 10 MEQ PO TBCR: 10.0000 meq | EXTENDED_RELEASE_TABLET | Freq: Two times a day (BID) | ORAL | 0 refills | Status: DC

## 2021-08-09 MED ORDER — SODIUM CHLORIDE 0.9 % IV BOLUS
1000.0000 mL | Freq: Once | INTRAVENOUS | Status: AC
  Administered 2021-08-09: 1000 mL via INTRAVENOUS

## 2021-08-09 MED ORDER — IOHEXOL 300 MG/ML  SOLN
100.0000 mL | Freq: Once | INTRAMUSCULAR | Status: AC | PRN
  Administered 2021-08-09: 100 mL via INTRAVENOUS

## 2021-08-09 NOTE — ED Notes (Signed)
Patient transported to CT 

## 2021-08-09 NOTE — ED Triage Notes (Signed)
Patient reports to the ER for abdominal pain. Reports he has had cramping, hunger pain type feeling, blood in his stool, and discomfort in his lower abdomen.  ?

## 2021-08-09 NOTE — Discharge Instructions (Addendum)
Please return to the ED with any new or worsening signs or symptoms like we discussed ?Please follow-up with your GI specialist.  I have sent an ambulatory referral however if you would rather see your wife's GI specialist please do so ?Please call [facility] for consultation concerning her hernias ?Please pick up your potassium tomorrow morning as we discussed. ?Please refer to the attached informational guides concerning inguinal hernias, eating with pancreatitis as well as pancreatitis in general. ?

## 2021-08-09 NOTE — ED Provider Notes (Signed)
?[facility] EMERGENCY DEPT ?Provider Note ? ? ?CSN: [id number] ?Arrival date & time: 08/09/21  1529 ? ?  ? ?History ? ?Chief Complaint  ?Patient presents with  ? Abdominal Pain  ? ? ?Darrell Nunez is a 46 y.o. male with history of hypertension, depression, bipolar disorder, anxiety, hearing impairment.  Patient presents ED for evaluation of abdominal pain.  Patient states that "3 Saturdays ago" he was having dinner with his wife when his stomach began to "tighten and constrict".  Patient states that discomfort is located suprapubically and diffusely across his entire lower abdomen.  Patient states that the pain has been constant since 3 Saturdays ago.  Patient states that he is unable to take 800 mg ibuprofen with relief of pain but pain always returns.  Patient states that he saw his PCP at [facility] 2 Saturdays ago and had a largely unremarkable work-up.  Patient also states that yesterday morning he noticed blood in his stool.  Patient states last bowel movement was today and was largely normal without appearance of blood in stool.  Patient is endorsing abdominal pain, diarrhea, blood in stool.  Patient denies nausea, vomiting, fevers, shortness of breath, lightheadedness, dizziness, weakness. ? ? ?Abdominal Pain ?Associated symptoms: diarrhea   ?Associated symptoms: no fever, no nausea, no shortness of breath and no vomiting   ? ?  ? ?Home Medications ?Prior to Admission medications   ?Medication Sig Start Date End Date Taking? Authorizing Provider  ?potassium chloride SA (KLOR-CON M) 10 MEQ tablet Take 1 tablet (10 mEq total) by mouth 2 (two) times daily for 5 days. 08/09/21 08/14/21 Yes Al Decant, PA-C  ?acetaminophen (TYLENOL) 500 MG tablet Take 1,000 mg by mouth every 6 (six) hours as needed for mild pain.    [provider]  ?ALPRAZolam Prudy Feeler) 1 MG tablet Take 1 mg by mouth 4 (four) times daily as needed. Anxiety    [provider]  ?aspirin-acetaminophen-caffeine  (EXCEDRIN MIGRAINE) 778 [id number] 7233 MG per tablet Take 1 tablet by mouth every 6 (six) hours as needed for pain (headache).    [provider]  ?cyclobenzaprine (FLEXERIL) 10 MG tablet Take 1 tablet (10 mg total) by mouth 2 (two) times daily as needed (muscle soreness). 08/10/17   Ward, Chase Picket, PA-C  ?gabapentin (NEURONTIN) 100 MG capsule Take 100 mg by mouth 3 (three) times daily as needed (PANIC).     [provider]  ?HYDROcodone-acetaminophen (NORCO) 5-325 MG tablet Take 1 tablet by mouth every 6 (six) hours as needed for moderate pain. 03/07/15   Leatha Gilding, MD  ?losartan (COZAAR) 100 MG tablet Take 100 mg by mouth daily.    [provider]  ?meloxicam (MOBIC) 7.5 MG tablet Take 1 tablet (7.5 mg total) by mouth daily. 08/10/17   Ward, Chase Picket, PA-C  ?metoprolol (LOPRESSOR) 50 MG tablet Take 50-100 mg by mouth 2 (two) times daily. 100 mg in morning at 10 am 50 mg at 4-5 pm    [provider]  ?Oxcarbazepine (TRILEPTAL) 300 MG tablet Take 150 mg by mouth 2 (two) times daily.     [provider]  ?ziprasidone (GEODON) 40 MG capsule Take 40 mg by mouth at bedtime.    [provider]  ?   ? ?Allergies    ?Depakote [divalproex sodium] and Lamictal [lamotrigine]   ? ?Review of Systems   ?Review of Systems  ?Constitutional:  Negative for fever.  ?Respiratory:  Negative for shortness of breath.   ?Gastrointestinal:  Positive for  abdominal pain, blood in stool and diarrhea. Negative for nausea and vomiting.  ?Neurological:  Negative for dizziness, weakness and light-headedness.  ?All other systems reviewed and are negative. ? ?Physical Exam ?Updated Vital Signs ?BP (!) 139/93 (BP Location: Right Arm)   Pulse 70   Temp 98.9 ?F (37.2 ?C)   Resp 18   SpO2 99%  ?Physical Exam ?Vitals and nursing note reviewed.  ?Constitutional:   ?   General: He is not in acute distress. ?   Appearance: He is well-developed. He is not ill-appearing, toxic-appearing or  diaphoretic.  ?HENT:  ?   Head: Normocephalic and atraumatic.  ?   Mouth/Throat:  ?   Mouth: Mucous membranes are moist.  ?   Pharynx: Oropharynx is clear.  ?Eyes:  ?   Extraocular Movements: Extraocular movements intact.  ?   Conjunctiva/sclera: Conjunctivae normal.  ?   Pupils: Pupils are equal, round, and reactive to light.  ?Cardiovascular:  ?   Rate and Rhythm: Normal rate and regular rhythm.  ?Pulmonary:  ?   Effort: Pulmonary effort is normal.  ?   Breath sounds: Normal breath sounds. No wheezing.  ?Abdominal:  ?   General: Abdomen is flat. Bowel sounds are normal.  ?   Palpations: Abdomen is soft.  ?   Tenderness: There is abdominal tenderness in the right lower quadrant, suprapubic area and left lower quadrant.  ?Musculoskeletal:  ?   Cervical back: Normal range of motion and neck supple. No tenderness.  ?Skin: ?   General: Skin is warm and dry.  ?   Capillary Refill: Capillary refill takes less than 2 seconds.  ?Neurological:  ?   Mental Status: He is alert and oriented to person, place, and time.  ? ? ?ED Results / Procedures / Treatments   ?Labs ?(all labs ordered are listed, but only abnormal results are displayed) ?Labs Reviewed  ?LIPASE, BLOOD - Abnormal; Notable for the following components:  ?    Result Value  ? Lipase 280 (*)   ? All other components within normal limits  ?COMPREHENSIVE METABOLIC PANEL - Abnormal; Notable for the following components:  ? Potassium 3.0 (*)   ? Glucose, Bld 217 (*)   ? ALT 47 (*)   ? All other components within normal limits  ?CBC - Abnormal; Notable for the following components:  ? WBC 10.6 (*)   ? All other components within normal limits  ?URINALYSIS, ROUTINE W REFLEX MICROSCOPIC - Abnormal; Notable for the following components:  ? Glucose, UA 250 (*)   ? Protein, ur 30 (*)   ? All other components within normal limits  ? ? ?EKG ?None ? ?Radiology ?CT ABDOMEN PELVIS W CONTRAST ? ?Result Date: 08/09/2021 ?CLINICAL DATA:  Abdominal pain, pancreatitis EXAM: CT ABDOMEN  AND PELVIS WITH CONTRAST TECHNIQUE: Multidetector CT imaging of the abdomen and pelvis was performed using the standard protocol following bolus administration of intravenous contrast. RADIATION DOSE REDUCTION: This exam was performed according to the departmental dose-optimization program which includes automated exposure control, adjustment of the mA and/or kV according to patient size and/or use of iterative reconstruction technique. CONTRAST:  OMNIPAQUE IOHEXOL 300 MG/ML  SOLN COMPARISON:  None. FINDINGS: Lower chest: Unremarkable.  Left hemidiaphragm is elevated. Hepatobiliary: There is fatty infiltration in the liver. Liver measures 21.5 cm in length. Gallbladder stones are seen. There is no wall thickening in gallbladder. Pancreas: No focal abnormality is seen. Spleen: Unremarkable. Adrenals/Urinary Tract: Adrenals are not enlarged. There is no hydronephrosis. There  are no renal or ureteral stones. Urinary bladder is unremarkable. Stomach/Bowel: Stomach is unremarkable. Small bowel loops are not dilated. Appendix is not dilated. There is no significant wall thickening in colon. Scattered diverticula are seen in colon without signs of focal diverticulitis. Vascular/Lymphatic: Unremarkable. Reproductive: Unremarkable. Other: There is no ascites or pneumoperitoneum. Small umbilical hernia containing fat is seen. Bilateral inguinal hernias containing fat are noted, larger on the left side. Musculoskeletal: There are few small scattered sclerotic densities in the pelvis largest measuring 12 mm in the right acetabulum. IMPRESSION: There is no evidence of intestinal obstruction or pneumoperitoneum. There is no hydronephrosis. Appendix is not dilated. Enlarged fatty liver.  Gallbladder stones.  Diverticulosis of colon. There are few small sclerotic densities in the pelvis and proximal femurs, possibly incidental benign bone islands. Less likely possibility would be sclerotic metastatic disease. If clinically  warranted, follow-up radionuclide bone scan may be considered. Other findings as described in the body of the report. Electronically Signed   By: Ernie AvenaPalani  Rathinasamy M.D.   On: 08/09/2021 19:05   ? ?Procedures ?Pr

## 2021-08-09 NOTE — ED Notes (Signed)
0.2 or 0.5 MRI is allowed with his Device with head wrap in place ?CT scan is fine and X-rays are fine ?C40+ is the cochlear implant in place.  ?

## 2021-08-16 DIAGNOSIS — R935 Abnormal findings on diagnostic imaging of other abdominal regions, including retroperitoneum: Secondary | ICD-10-CM | POA: Diagnosis not present

## 2021-08-16 DIAGNOSIS — Q782 Osteopetrosis: Secondary | ICD-10-CM | POA: Diagnosis not present

## 2021-08-16 DIAGNOSIS — R1084 Generalized abdominal pain: Secondary | ICD-10-CM | POA: Diagnosis not present

## 2021-08-16 DIAGNOSIS — E7800 Pure hypercholesterolemia, unspecified: Secondary | ICD-10-CM | POA: Diagnosis not present

## 2021-08-16 DIAGNOSIS — E1169 Type 2 diabetes mellitus with other specified complication: Secondary | ICD-10-CM | POA: Diagnosis not present

## 2021-08-16 DIAGNOSIS — K859 Acute pancreatitis without necrosis or infection, unspecified: Secondary | ICD-10-CM | POA: Diagnosis not present

## 2021-08-16 DIAGNOSIS — K760 Fatty (change of) liver, not elsewhere classified: Secondary | ICD-10-CM | POA: Diagnosis not present

## 2021-08-16 DIAGNOSIS — E876 Hypokalemia: Secondary | ICD-10-CM | POA: Diagnosis not present

## 2021-08-16 DIAGNOSIS — K469 Unspecified abdominal hernia without obstruction or gangrene: Secondary | ICD-10-CM | POA: Diagnosis not present

## 2021-08-20 ENCOUNTER — Other Ambulatory Visit: Payer: Self-pay | Admitting: Family Medicine

## 2021-08-20 ENCOUNTER — Other Ambulatory Visit (HOSPITAL_COMMUNITY): Payer: Self-pay | Admitting: Family Medicine

## 2021-08-20 DIAGNOSIS — Q782 Osteopetrosis: Secondary | ICD-10-CM

## 2021-08-25 ENCOUNTER — Encounter (HOSPITAL_BASED_OUTPATIENT_CLINIC_OR_DEPARTMENT_OTHER): Payer: Self-pay

## 2021-08-25 ENCOUNTER — Other Ambulatory Visit: Payer: Self-pay

## 2021-08-25 ENCOUNTER — Emergency Department (HOSPITAL_BASED_OUTPATIENT_CLINIC_OR_DEPARTMENT_OTHER): Payer: Medicare HMO

## 2021-08-25 ENCOUNTER — Emergency Department (HOSPITAL_BASED_OUTPATIENT_CLINIC_OR_DEPARTMENT_OTHER)
Admission: EM | Admit: 2021-08-25 | Discharge: 2021-08-25 | Disposition: A | Discharge status: Home / Self Care | Payer: Medicare HMO | Attending: Emergency Medicine | Admitting: Emergency Medicine

## 2021-08-25 DIAGNOSIS — R7401 Elevation of levels of liver transaminase levels: Secondary | ICD-10-CM | POA: Diagnosis not present

## 2021-08-25 DIAGNOSIS — R1032 Left lower quadrant pain: Secondary | ICD-10-CM

## 2021-08-25 DIAGNOSIS — R739 Hyperglycemia, unspecified: Secondary | ICD-10-CM | POA: Insufficient documentation

## 2021-08-25 DIAGNOSIS — K4020 Bilateral inguinal hernia, without obstruction or gangrene, not specified as recurrent: Secondary | ICD-10-CM | POA: Insufficient documentation

## 2021-08-25 DIAGNOSIS — R109 Unspecified abdominal pain: Secondary | ICD-10-CM | POA: Diagnosis not present

## 2021-08-25 DIAGNOSIS — K4090 Unilateral inguinal hernia, without obstruction or gangrene, not specified as recurrent: Secondary | ICD-10-CM | POA: Diagnosis not present

## 2021-08-25 DIAGNOSIS — Z79899 Other long term (current) drug therapy: Secondary | ICD-10-CM | POA: Insufficient documentation

## 2021-08-25 DIAGNOSIS — E876 Hypokalemia: Secondary | ICD-10-CM | POA: Insufficient documentation

## 2021-08-25 DIAGNOSIS — R748 Abnormal levels of other serum enzymes: Secondary | ICD-10-CM | POA: Insufficient documentation

## 2021-08-25 DIAGNOSIS — K859 Acute pancreatitis without necrosis or infection, unspecified: Secondary | ICD-10-CM | POA: Insufficient documentation

## 2021-08-25 LAB — CBC
HCT: 41.6 % (ref 39.0–52.0)
Hemoglobin: 14.8 g/dL (ref 13.0–17.0)
MCH: 29.2 pg (ref 26.0–34.0)
MCHC: 35.6 g/dL (ref 30.0–36.0)
MCV: 82.1 fL (ref 80.0–100.0)
Platelets: 279 10*3/uL (ref 150–400)
RBC: 5.07 MIL/uL (ref 4.22–5.81)
RDW: 13.4 % (ref 11.5–15.5)
WBC: 9.9 10*3/uL (ref 4.0–10.5)
nRBC: 0 % (ref 0.0–0.2)

## 2021-08-25 LAB — COMPREHENSIVE METABOLIC PANEL
ALT: 44 U/L (ref 0–44)
AST: 34 U/L (ref 15–41)
Albumin: 4.5 g/dL (ref 3.5–5.0)
Alkaline Phosphatase: 74 U/L (ref 38–126)
Anion gap: 13 (ref 5–15)
BUN: 12 mg/dL (ref 6–20)
CO2: 30 mmol/L (ref 22–32)
Calcium: 9.8 mg/dL (ref 8.9–10.3)
Chloride: 96 mmol/L — ABNORMAL LOW (ref 98–111)
Creatinine, Ser: 1.02 mg/dL (ref 0.61–1.24)
GFR, Estimated: >60 mL/min (ref >60)
Glucose, Bld: 167 mg/dL — ABNORMAL HIGH (ref 70–99)
Potassium: 3.2 mmol/L — ABNORMAL LOW (ref 3.5–5.1)
Sodium: 139 mmol/L (ref 135–145)
Total Bilirubin: 0.5 mg/dL (ref 0.3–1.2)
Total Protein: 7.3 g/dL (ref 6.5–8.1)

## 2021-08-25 LAB — LIPASE, BLOOD: Lipase: 348 U/L — ABNORMAL HIGH (ref 11–51)

## 2021-08-25 LAB — URINALYSIS, ROUTINE W REFLEX MICROSCOPIC
Bilirubin Urine: NEGATIVE
Glucose, UA: NEGATIVE mg/dL
Hgb urine dipstick: NEGATIVE
Ketones, ur: NEGATIVE mg/dL
Leukocytes,Ua: NEGATIVE
Nitrite: NEGATIVE
Protein, ur: 30 mg/dL — AB
Specific Gravity, Urine: 1.023 (ref 1.005–1.030)
pH: 5.5 (ref 5.0–8.0)

## 2021-08-25 MED ORDER — DICYCLOMINE HCL 20 MG PO TABS: 20.0000 mg | ORAL_TABLET | Freq: Two times a day (BID) | ORAL | 0 refills | Status: DC

## 2021-08-25 MED ORDER — OXYCODONE-ACETAMINOPHEN 5-325 MG PO TABS: 1.0000 | ORAL_TABLET | Freq: Four times a day (QID) | ORAL | 0 refills | Status: DC | PRN

## 2021-08-25 MED ORDER — MORPHINE SULFATE (PF) 4 MG/ML IV SOLN
4.0000 mg | Freq: Once | INTRAVENOUS | Status: AC
  Administered 2021-08-25: 4 mg via INTRAVENOUS
  Filled 2021-08-25: qty 1

## 2021-08-25 MED ORDER — IOHEXOL 300 MG/ML  SOLN
100.0000 mL | Freq: Once | INTRAMUSCULAR | Status: AC | PRN
  Administered 2021-08-25: 100 mL via INTRAVENOUS

## 2021-08-25 MED ORDER — POTASSIUM CHLORIDE CRYS ER 20 MEQ PO TBCR
40.0000 meq | EXTENDED_RELEASE_TABLET | Freq: Once | ORAL | Status: AC
  Administered 2021-08-25: 40 meq via ORAL
  Filled 2021-08-25: qty 2

## 2021-08-25 MED ORDER — ONDANSETRON HCL 4 MG PO TABS: 4.0000 mg | ORAL_TABLET | Freq: Four times a day (QID) | ORAL | 0 refills | Status: DC

## 2021-08-25 NOTE — ED Provider Notes (Signed)
I provided a substantive portion of the care of this patient.  I personally performed the entirety of the medical decision making for this encounter. ? ? ?Patient presented with midepigastric pain unrelieved with his home medications.  Has evidence of increasing lipase here.  He has not had any emesis.  Labs are overall reassuring.  Discussed with patient and his spouse and patient will be placed on clear liquid diet, given pain medication, and will see his gastroenterologist ?   ? ? ? ?  ?Lorre Nick, MD ?08/25/21 2306 ? ?

## 2021-08-25 NOTE — ED Triage Notes (Signed)
Patient here POV from Home with ABD Pain. ? ?Patient has had Intermittent Mid ABD Pain radiating to Lower ABD Region. ? ?No Other Discernable Symptoms. ? ?Tramadol at 1515. Somewhat Effective.  ? ?NAD Noted during Triage. A&Ox4. GCS 15. Ambulatory. ?

## 2021-08-25 NOTE — Discharge Instructions (Addendum)
You were seen here today for evaluation of your chronic abdominal pain. Your CT scan was unchanged from your previous. Your lipase seems to be worsening. I have prescribed your narcotic pain medication and nausea medication as well, you can take these as needed. I have also prescribed you Bentyl to take 30 minutes before meals. Please follow up with  GI as soon as possible. As discussed, please stick to a clear liquid diet for the next 2 days. I have attached more information on this to the packet, please read.  ? ?Contact a doctor if: ?You do not get better as quickly as expected. ?You have new symptoms. ?Your symptoms get worse. ?You have pain or weakness that lasts a long time. ?You keep feeling sick to your stomach. ?You get better and then you have pain again. ?You have a fever. ?Get help right away if: ?You cannot eat or keep fluids down. ?Your pain gets very bad. ?Your skin or the white part of your eyes turns yellow. ?You have sudden swelling in your belly. ?You throw up. ?You feel dizzy or you pass out (faint). ?Your blood sugar is high (over 300 mg/dL). ?

## 2021-08-25 NOTE — ED Provider Notes (Signed)
?[facility] ?Provider Note ? ? ?CSN: [id number] ?Arrival date & time: 08/25/21  1600 ? ?  ? ?History ?Chief Complaint  ?Patient presents with  ? Abdominal Pain  ? ? ?Darrell Nunez is a 46 y.o. male presents the emergency department for evaluation of waxing and waning left lower quadrant pain for the past 5 to 6 weeks.  He reports that he was sharp and tingling feeling.  He is seen for this in the emergency department is also follow-up with his primary care provider he is finding some relief with tramadol.  He was initially referred to a gastroenterologist, but they deferred this appointment as to get evaluated for his hernias that were seen with his last CT a few days ago.  He denies any chest pain, shortness of breath, nausea, vomiting, diarrhea, constipation.  Denies any hematochezia, melena, fevers, or chills.  He has had no change in his appetite.  Denies any testicular or penile, or scrotal pain, swelling, or involvement.  Denies any dysuria or hematuria. He is also requesting basic labs work be obtained as he doesn't like needles and has a PCP appointment soon.  ? ? ?Abdominal Pain ?Associated symptoms: no chest pain, no chills, no constipation, no cough, no diarrhea, no fever, no nausea, no shortness of breath and no vomiting   ? ?  ? ?Home Medications ?Prior to Admission medications   ?Medication Sig Start Date End Date Taking? Authorizing Provider  ?acetaminophen (TYLENOL) 500 MG tablet Take 1,000 mg by mouth every 6 (six) hours as needed for mild pain.    [provider]  ?ALPRAZolam Duanne Moron) 1 MG tablet Take 1 mg by mouth 4 (four) times daily as needed. Anxiety    [provider]  ?aspirin-acetaminophen-caffeine (EXCEDRIN MIGRAINE) [id number] MG per tablet Take 1 tablet by mouth every 6 (six) hours as needed for pain (headache).    [provider]  ?cyclobenzaprine (FLEXERIL) 10 MG tablet Take 1 tablet (10 mg total) by mouth 2 (two) times daily as  needed (muscle soreness). 08/10/17   Ward, Ozella Almond, PA-C  ?gabapentin (NEURONTIN) 100 MG capsule Take 100 mg by mouth 3 (three) times daily as needed (PANIC).     [provider]  ?HYDROcodone-acetaminophen (NORCO) 5-325 MG tablet Take 1 tablet by mouth every 6 (six) hours as needed for moderate pain. 03/07/15   Caren Griffins, MD  ?losartan (COZAAR) 100 MG tablet Take 100 mg by mouth daily.    [provider]  ?meloxicam (MOBIC) 7.5 MG tablet Take 1 tablet (7.5 mg total) by mouth daily. 08/10/17   Ward, Ozella Almond, PA-C  ?metoprolol (LOPRESSOR) 50 MG tablet Take 50-100 mg by mouth 2 (two) times daily. 100 mg in morning at 10 am 50 mg at 4-5 pm    [provider]  ?Oxcarbazepine (TRILEPTAL) 300 MG tablet Take 150 mg by mouth 2 (two) times daily.     [provider]  ?potassium chloride SA (KLOR-CON M) 10 MEQ tablet Take 1 tablet (10 mEq total) by mouth 2 (two) times daily for 5 days. 08/09/21 08/14/21  Azucena Cecil, PA-C  ?ziprasidone (GEODON) 40 MG capsule Take 40 mg by mouth at bedtime.    [provider]  ?   ? ?Allergies    ?Depakote [divalproex sodium] and Lamictal [lamotrigine]   ? ?Review of Systems   ?Review of Systems  ?Constitutional:  Negative for chills and fever.  ?Respiratory:  Negative for cough and shortness of breath.   ?  Cardiovascular:  Negative for chest pain.  ?Gastrointestinal:  Positive for abdominal pain. Negative for constipation, diarrhea, nausea and vomiting.  ? ?Physical Exam ?Updated Vital Signs ?BP 123/66   Pulse 63   Temp 98 ?F (36.7 ?C) (Oral)   Resp 11   Ht 5\' 8"  (1.727 m)   Wt 128.3 kg   SpO2 96%   BMI 43.01 kg/m?  ?Physical Exam ?Vitals and nursing note reviewed.  ?Constitutional:   ?   General: He is not in acute distress. ?   Appearance: Normal appearance. He is not toxic-appearing.  ?HENT:  ?   Head: Normocephalic and atraumatic.  ?Eyes:  ?   General: No scleral icterus. ?Cardiovascular:  ?   Rate and Rhythm:  Normal rate and regular rhythm.  ?Pulmonary:  ?   Effort: Pulmonary effort is normal.  ?   Breath sounds: Normal breath sounds.  ?Abdominal:  ?   General: Bowel sounds are normal. There is no distension.  ?   Palpations: Abdomen is soft.  ?   Tenderness: There is abdominal tenderness in the periumbilical area and left lower quadrant. There is no right CVA tenderness, left CVA tenderness, guarding or rebound.  ?   Comments: Abdominal exam limited secondary to body habitus.  Normal active bowel sounds.  Tenderness to the periumbilical and left lower quadrant area.  No guarding or rebound noted.  ?Musculoskeletal:     ?   General: No deformity.  ?   Cervical back: Normal range of motion.  ?Skin: ?   General: Skin is warm and dry.  ?Neurological:  ?   General: No focal deficit present.  ?   Mental Status: He is alert. Mental status is at baseline.  ? ? ?ED Results / Procedures / Treatments   ?Labs ?(all labs ordered are listed, but only abnormal results are displayed) ?Labs Reviewed  ?LIPASE, BLOOD - Abnormal; Notable for the following components:  ?    Result Value  ? Lipase 348 (*)   ? All other components within normal limits  ?COMPREHENSIVE METABOLIC PANEL - Abnormal; Notable for the following components:  ? Potassium 3.2 (*)   ? Chloride 96 (*)   ? Glucose, Bld 167 (*)   ? All other components within normal limits  ?URINALYSIS, ROUTINE W REFLEX MICROSCOPIC - Abnormal; Notable for the following components:  ? Protein, ur 30 (*)   ? All other components within normal limits  ?CBC  ?LIPID PANEL  ?TSH  ?T4, FREE  ?HEMOGLOBIN A1C  ? ? ?EKG ?None ? ?Radiology ?CT ABDOMEN PELVIS W CONTRAST ? ?Result Date: 08/25/2021 ?CLINICAL DATA:  Intermittent abdominal pain, history of pancreatitis EXAM: CT ABDOMEN AND PELVIS WITH CONTRAST TECHNIQUE: Multidetector CT imaging of the abdomen and pelvis was performed using the standard protocol following bolus administration of intravenous contrast. RADIATION DOSE REDUCTION: This exam was  performed according to the departmental dose-optimization program which includes automated exposure control, adjustment of the mA and/or kV according to patient size and/or use of iterative reconstruction technique. CONTRAST:  110mL OMNIPAQUE IOHEXOL 300 MG/ML  SOLN COMPARISON:  08/09/2021 FINDINGS: Lower chest: No acute abnormality. Hepatobiliary: Fatty infiltration of the liver is noted. Gallbladder is well distended with multiple dependent gallstones. No complicating factors are noted. Pancreas: Unremarkable. No pancreatic ductal dilatation or surrounding inflammatory changes. Spleen: Normal in size without focal abnormality. Adrenals/Urinary Tract: Adrenal glands are within normal limits. Kidneys demonstrate a normal enhancement pattern bilaterally. No renal calculi or obstructive changes are seen. The bladder is  within normal limits. Stomach/Bowel: No obstructive or inflammatory changes of the colon are noted. Minimal diverticular changes noted. The appendix is within normal limits. Small bowel and stomach are unremarkable. Vascular/Lymphatic: No significant vascular findings are present. No enlarged abdominal or pelvic lymph nodes. Reproductive: Prostate is unremarkable. Other: Fat containing inguinal hernias are again noted. No incarceration is noted. No ascites is seen. Musculoskeletal: Stable sclerotic densities in the pelvis likely related to bone islands. IMPRESSION: Overall stable appearance when compared with the prior exam. Cholelithiasis without complicating factors. Diverticulosis without diverticulitis. No acute abnormality noted. Electronically Signed   By: Inez Catalina M.D.   On: 08/25/2021 21:24   ? ?Procedures ?Procedures  ? ?Medications Ordered in ED ?Medications  ?potassium chloride SA (KLOR-CON M) CR tablet 40 mEq (40 mEq Oral Given 08/25/21 2130)  ?iohexol (OMNIPAQUE) 300 MG/ML solution 100 mL (100 mLs Intravenous Contrast Given 08/25/21 2107)  ?morphine (PF) 4 MG/ML injection 4 mg (4 mg  Intravenous Given 08/25/21 2125)  ? ? ?ED Course/ Medical Decision Making/ A&P ?  ?                        ?Medical Decision Making ?Amount and/or Complexity of Data Reviewed ?Labs: ordered. ?Radiology: ordered. ? ?Risk ?Pre

## 2021-08-26 LAB — LIPID PANEL
Cholesterol: 191 mg/dL (ref 0–200)
HDL: 38 mg/dL — ABNORMAL LOW (ref >40)
LDL Cholesterol: 88 mg/dL (ref 0–99)
Total CHOL/HDL Ratio: 5 RATIO
Triglycerides: 327 mg/dL — ABNORMAL HIGH (ref <150)
VLDL: 65 mg/dL — ABNORMAL HIGH (ref 0–40)

## 2021-08-26 LAB — T4, FREE: Free T4: 0.58 ng/dL — ABNORMAL LOW (ref 0.61–1.12)

## 2021-08-26 LAB — TSH: TSH: 0.746 u[IU]/mL (ref 0.350–4.500)

## 2021-08-26 LAB — HEMOGLOBIN A1C
Hgb A1c MFr Bld: 8.2 % — ABNORMAL HIGH (ref 4.8–5.6)
Mean Plasma Glucose: 188.64 mg/dL

## 2021-09-04 ENCOUNTER — Encounter (HOSPITAL_COMMUNITY)
Admission: RE | Admit: 2021-09-04 | Discharge: 2021-09-04 | Disposition: A | Discharge status: Home / Self Care | Payer: Medicare HMO | Source: Ambulatory Visit | Attending: Family Medicine | Admitting: Family Medicine

## 2021-09-04 DIAGNOSIS — Q782 Osteopetrosis: Secondary | ICD-10-CM | POA: Diagnosis not present

## 2021-09-04 DIAGNOSIS — M533 Sacrococcygeal disorders, not elsewhere classified: Secondary | ICD-10-CM | POA: Diagnosis not present

## 2021-09-04 DIAGNOSIS — M19072 Primary osteoarthritis, left ankle and foot: Secondary | ICD-10-CM | POA: Diagnosis not present

## 2021-09-04 DIAGNOSIS — R1907 Generalized intra-abdominal and pelvic swelling, mass and lump: Secondary | ICD-10-CM | POA: Diagnosis not present

## 2021-09-04 DIAGNOSIS — M1712 Unilateral primary osteoarthritis, left knee: Secondary | ICD-10-CM | POA: Diagnosis not present

## 2021-09-04 MED ORDER — TECHNETIUM TC 99M MEDRONATE IV KIT
20.0000 | PACK | Freq: Once | INTRAVENOUS | Status: AC | PRN
  Administered 2021-09-04: 20 via INTRAVENOUS

## 2021-09-09 DIAGNOSIS — G4733 Obstructive sleep apnea (adult) (pediatric): Secondary | ICD-10-CM | POA: Insufficient documentation

## 2021-09-09 DIAGNOSIS — I152 Hypertension secondary to endocrine disorders: Secondary | ICD-10-CM | POA: Insufficient documentation

## 2021-09-09 DIAGNOSIS — E1169 Type 2 diabetes mellitus with other specified complication: Secondary | ICD-10-CM | POA: Insufficient documentation

## 2021-09-09 DIAGNOSIS — I10 Essential (primary) hypertension: Secondary | ICD-10-CM | POA: Insufficient documentation

## 2021-09-09 DIAGNOSIS — E7800 Pure hypercholesterolemia, unspecified: Secondary | ICD-10-CM

## 2021-09-09 DIAGNOSIS — K760 Fatty (change of) liver, not elsewhere classified: Secondary | ICD-10-CM | POA: Insufficient documentation

## 2021-09-09 HISTORY — DX: Pure hypercholesterolemia, unspecified: E78.00

## 2021-09-11 ENCOUNTER — Encounter: Payer: Self-pay | Admitting: Nurse Practitioner

## 2021-09-11 ENCOUNTER — Ambulatory Visit: Payer: Medicare HMO | Admitting: Nurse Practitioner

## 2021-09-11 VITALS — BP 120/70 | HR 71 | Ht 68.0 in | Wt 280.0 lb

## 2021-09-11 DIAGNOSIS — R079 Chest pain, unspecified: Secondary | ICD-10-CM

## 2021-09-11 DIAGNOSIS — R748 Abnormal levels of other serum enzymes: Secondary | ICD-10-CM | POA: Diagnosis not present

## 2021-09-11 DIAGNOSIS — E1169 Type 2 diabetes mellitus with other specified complication: Secondary | ICD-10-CM

## 2021-09-11 NOTE — Patient Instructions (Addendum)
?  1)  Cardiac clearance required required prior to scheduling a colonoscopy. Our office will enter a cardiology referral.  ? ?2) Go to the local emergency room if left chest pain recurs  ? ?3) Continue Dicyclomine 20mg  one tab by mouth every 8 hours as needed for abdominal pain  ? ?4) Repeat lipase level in 4 weeks  ? ?A referral has been placed to cardiology. They should contact you in the next week or so. ? ?Thank you for trusting me with your gastrointestinal care!   ? ? , CRNP ? ? ? ?BMI: ? ?If you are age 98 or older, your body mass index should be between 23-30. Your Body mass index is 42.57 kg/m?02-23-1993 If this is out of the aforementioned range listed, please consider follow up with your Primary Care Provider. ? ?If you are age 10 or younger, your body mass index should be between 19-25. Your Body mass index is 42.57 kg/m?06-21-1970 If this is out of the aformentioned range listed, please consider follow up with your Primary Care Provider.  ? ?MY CHART: ? ?The [facility] providers would like to encourage you to use [facility] to communicate with providers for non-urgent requests or questions.  Due to long hold times on the telephone, sending your provider a message by [facility] may be a faster and more efficient way to get a response.  Please allow 48 business hours for a response.  Please remember that this is for non-urgent requests.  ? ?

## 2021-09-11 NOTE — Progress Notes (Signed)
? ? ? ?09/11/2021 ?Darrell Nunez ?[id number] ?[birth year] ? ? ?CHIEF COMPLAINT: Lower abdominal pain  ? ?HISTORY OF PRESENT ILLNESS: Darrell Nunez is a 46 year old male with a past medical history of anxiety, depression, bipolar disorder, hypertension and hearing impairment s/p bilateral cochlear implants in 2000. He presents to our office today for further evaluation regarding lower abdominal pain.  ? ?He presents to our office today as referred by Delice Bison PA-C for further evaluation regarding lower abdominal pain.  He is accompanied by his wife. He went out to eat dinner with his wife on Saturday 2/18 or 07/13/2021 then went to the dance hall and developed central lower abdominal pain about 2 to 3 inches below his umbilical area. He described this pain as sharp and radiated to the LLQ. He tried to ignore his lower abdominal pain for 2 weeks, however, this pain worsened so he went to [facility] urgent care clinic for further evaluation. He was prescribed Tylenol 500mg  2 tabs bid. One week later, he developed worsening central lower abdominal pain during the middle of the night, was in the fetal position throughout the rest of the night. No fever, nausea, vomiting or diarrhea. Two days later, he presented to [facility] ED 08/09/2021 with lower abdominal pain which progressively worsened x 3 weeks. He also noted seeing blood in his stool on 08/08/2021. Labs in the ED showed a low potassium level of 3.0. Elevated Lipase 280. ALT 47.WBC 10.6.  Hemoglobin 14.8 CTAP with contrast showed a normal pancreas, gallstones without gallbladder wall thickening, enlarged fatty liver, bilateral inguinal hernias and a few small sclerotic densities in the pelvis and proximal femurs.  He was advised to follow-up with his PCP to consider a bone scan to further evaluate the sclerotic densities.  He was also referred to general surgery for further evaluation regarding his inguinal hernias. He received KCL replacement and  was discharged home.  ? ?He continued to have off and on central to LLQ pain therefore he presented back to the ED 08/25/2021.  CTAP with contrast showed cholelithiasis without evidence of acute cholecystitis, diverticulosis without evidence of diverticulitis.  The pancreas was normal.  Labs showed a lipase level of 348.  Triglycerides 327.  Normal LFTs.  WBC 9.0.  Hemoglobin 14.8.  Hemoglobin A1c 8.2%.  He was prescribed dicyclomine 20 mg twice daily, ondansetron 4 mg every 6 hours and oxycodone 5/325 mg 1 tab every 6 hours as needed and was discharged home with instructions to proceed with GI evaluation as scheduled. ? ?He denies having any dysphagia or heartburn.  No nausea or vomiting.  No upper abdominal pain.  He drinks one amaretto mixed cocktail every one or two months.  No prior history of pancreatitis.  He typically passes a normal formed brown bowel movement daily.  However, if he develops an upset stomach he takes Pepto-Bismol and the next bowel movement or two turn black.  No bright red rectal bleeding.  On Wednesday 08/28/2021, he was in his backyard and developed abrupt urgency and passed a large amount of light tan-colored piece D stool and within 24 hours his central and LLQ pain abated without recurrence.  No further lighter colored stools.  He resumed his normal bowel pattern consisting of a normal brown stool once daily.  He denies ever having a screening colonoscopy.  No known family history of IBD or colorectal cancer. ? ?He describes having a left chest pain with associated numbness to the left chest area which occurs after  exertion and last for a few minutes then goes away after he rests.  No associated shortness of breath or dizziness.  He has a history of a left torn labrum and he thought his chest pain was likely due to his chronic shoulder pain.  However, his left chest pain does not occur when moving his left shoulder. He is cardiac risk factors include obesity, hypertension,  hyperlipidemia, diabetes and positive family history of CAD. ? ?Bone scan 09/04/2021 without evidence of malignancy.  ?  ? ? ?  Latest Ref Rng & Units 08/25/2021  ?  4:28 PM 08/09/2021  ?  3:56 PM 03/05/2015  ?  8:20 PM  ?CBC  ?WBC 4.0 - 10.5 K/uL 9.9   10.6   13.1    ?Hemoglobin 13.0 - 17.0 g/dL 09.8   11.9   14.7    ?Hematocrit 39.0 - 52.0 % 41.6   42.2   44.3    ?Platelets 150 - 400 K/uL 279   287   295    ?  ? ?  Latest Ref Rng & Units 08/25/2021  ?  4:28 PM 08/09/2021  ?  3:56 PM 03/05/2015  ?  8:20 PM  ?CMP  ?Glucose 70 - 99 mg/dL 829   562   130    ?BUN 6 - 20 mg/dL 12   9   18     ?Creatinine 0.61 - 1.24 mg/dL   8.65   7.84    ?Sodium 135 - 145 mmol/L 139   139   139    ?Potassium 3.5 - 5.1 mmol/L 3.2   3.0   3.5    ?Chloride 98 - 111 mmol/L 96   101   106    ?CO2 22 - 32 mmol/L 30   27   26     ?Calcium 8.9 - 10.3 mg/dL 9.8   9.2   9.1    ?Total Protein 6.5 - 8.1 g/dL 7.3   7.5     ?Total Bilirubin 0.3 - 1.2 mg/dL 0.5   0.4     ?Alkaline Phos 38 - 126 U/L 74   75     ?AST 15 - 41 U/L 34   37     ?ALT 0 - 44 U/L 44   47     ?  ?Triglycerides 327 ? ?CTAP 08/09/2021: ?Lower chest: Unremarkable.  Left hemidiaphragm is elevated. ?  ?Hepatobiliary: There is fatty infiltration in the liver. Liver ?measures 21.5 cm in length. Gallbladder stones are seen. There is no ?wall thickening in gallbladder. ?  ?Pancreas: No focal abnormality is seen. ?  ?Spleen: Unremarkable. ?  ?Adrenals/Urinary Tract: Adrenals are not enlarged. There is no ?hydronephrosis. There are no renal or ureteral stones. Urinary ?bladder is unremarkable. ?  ?Stomach/Bowel: Stomach is unremarkable. Small bowel loops are not ?dilated. Appendix is not dilated. There is no significant wall ?thickening in colon. Scattered diverticula are seen in colon without ?signs of focal diverticulitis. ?  ?Vascular/Lymphatic: Unremarkable. ?  ?Reproductive: Unremarkable. ?  ?Other: There is no ascites or pneumoperitoneum. Small umbilical ?hernia containing fat is seen.  Bilateral inguinal hernias containing ?fat are noted, larger on the left side. ?  ?Musculoskeletal: There are few small scattered sclerotic densities ?in the pelvis largest measuring 12 mm in the right acetabulum. ?  ?IMPRESSION: ?There is no evidence of intestinal obstruction or pneumoperitoneum. ?There is no hydronephrosis. Appendix is not dilated. ?  ?Enlarged fatty liver.  Gallbladder stones.  Diverticulosis of colon. ?  ?  There are few small sclerotic densities in the pelvis and proximal ?femurs, possibly incidental benign bone islands. Less likely ?possibility would be sclerotic metastatic disease. If clinically ?warranted, follow-up radionuclide bone scan may be considered. ?  ?Other findings as described in the body of the report. ? ?CTAP with contrast 08/25/2021: ?Lower chest: No acute abnormality. ?  ?Hepatobiliary: Fatty infiltration of the liver is noted. Gallbladder ?is well distended with multiple dependent gallstones. No ?complicating factors are noted. ?  ?Pancreas: Unremarkable. No pancreatic ductal dilatation or ?surrounding inflammatory changes. ?  ?Spleen: Normal in size without focal abnormality. ?  ?Adrenals/Urinary Tract: Adrenal glands are within normal limits. ?Kidneys demonstrate a normal enhancement pattern bilaterally. No ?renal calculi or obstructive changes are seen. The bladder is within ?normal limits. ?  ?Stomach/Bowel: No obstructive or inflammatory changes of the colon ?are noted. Minimal diverticular changes noted. The appendix is ?within normal limits. Small bowel and stomach are unremarkable. ?  ?Vascular/Lymphatic: No significant vascular findings are present. No ?enlarged abdominal or pelvic lymph nodes. ?  ?Reproductive: Prostate is unremarkable. ?  ?Other: Fat containing inguinal hernias are again noted. No ?incarceration is noted. No ascites is seen. ?  ?Musculoskeletal: Stable sclerotic densities in the pelvis likely ?related to bone islands. ?  ?IMPRESSION: ?Overall stable  appearance when compared with the prior exam. ?  ?Cholelithiasis without complicating factors. ?  ?Diverticulosis without diverticulitis. ?  ?No acute abnormality noted. ? ?NM bone scan whole body 09/04/2021: ?No sci

## 2021-09-11 NOTE — Progress Notes (Signed)
I agree with the above note, plan 

## 2021-09-12 DIAGNOSIS — E1169 Type 2 diabetes mellitus with other specified complication: Secondary | ICD-10-CM | POA: Diagnosis not present

## 2021-09-12 DIAGNOSIS — K8020 Calculus of gallbladder without cholecystitis without obstruction: Secondary | ICD-10-CM | POA: Diagnosis not present

## 2021-09-12 DIAGNOSIS — E781 Pure hyperglyceridemia: Secondary | ICD-10-CM | POA: Diagnosis not present

## 2021-09-12 DIAGNOSIS — K760 Fatty (change of) liver, not elsewhere classified: Secondary | ICD-10-CM | POA: Diagnosis not present

## 2021-09-12 DIAGNOSIS — K429 Umbilical hernia without obstruction or gangrene: Secondary | ICD-10-CM | POA: Diagnosis not present

## 2021-09-12 DIAGNOSIS — K4020 Bilateral inguinal hernia, without obstruction or gangrene, not specified as recurrent: Secondary | ICD-10-CM | POA: Diagnosis not present

## 2021-09-12 DIAGNOSIS — R748 Abnormal levels of other serum enzymes: Secondary | ICD-10-CM | POA: Diagnosis not present

## 2021-09-12 DIAGNOSIS — I10 Essential (primary) hypertension: Secondary | ICD-10-CM | POA: Diagnosis not present

## 2021-10-03 ENCOUNTER — Encounter: Payer: Self-pay | Admitting: Cardiology

## 2021-10-03 ENCOUNTER — Ambulatory Visit (INDEPENDENT_AMBULATORY_CARE_PROVIDER_SITE_OTHER): Payer: Medicare HMO | Admitting: Cardiology

## 2021-10-03 VITALS — BP 126/80 | HR 69 | Ht 68.0 in | Wt 284.0 lb

## 2021-10-03 DIAGNOSIS — Z01812 Encounter for preprocedural laboratory examination: Secondary | ICD-10-CM | POA: Diagnosis not present

## 2021-10-03 DIAGNOSIS — R079 Chest pain, unspecified: Secondary | ICD-10-CM | POA: Diagnosis not present

## 2021-10-03 DIAGNOSIS — I10 Essential (primary) hypertension: Secondary | ICD-10-CM

## 2021-10-03 NOTE — Progress Notes (Signed)
Cardiology Office Note:    Date:  10/03/2021   ID:  Darrell Nunez, DOB September 06, 1975, MRN [id number]  PCP:  Wilfrid Lund, PA   [facility] Providers Cardiologist:  None     Referring MD: Alcide Evener *   History of Present Illness:    Darrell Nunez is a 46 y.o. male here for the evaluation of chest pain, hypertension, and hyperlipidemia, at the request of Alcide Evener, NP. He also presents for cardiac clearance for colonoscopy.  On 09/11/2021 he saw Alcide Evener, NP for GI follow-up. He also reported having left chest pain with associated numbness to his left chest; occurring after exertion for a few minutes before resolving after rest. Onset of chest pain was 1 month prior. He noted a previously left torn labrum, but there was no pain with movement of his left shoulder. He was referred to cardiology due chest pain in the setting of risk factors of obesity, hypertension, hyperlipidemia, diabetes, and positive family history of CAD (grandfather died of MI at 16 yo.  After 2 weeks of persistent sharp umbilical pain radiating to the LLQ he went to urgent care and was given Tylenol 500 mg 2 tablets BID. He presented to the ED 08/09/21 with progressively worsening lower abdominal pain associated with some hematochezia. He received KCL replacement and was discharged. He returned to the ED 08/25/2021; CTAP with contrast showed cholelithiasis without evidence of acute cholecystitis, and diverticulosis without diverticulitis. Prescribed dicyclomine 20 mg twice daily, ondansetron 4 mg every 6 hours and oxycodone 5/325 mg 1 tab every 6 hours as needed. He had a bone scan 09/04/21 without evidence of malignancy.   Has bilateral inguinal hernias. Scheduled for general surgery consult.  Noted to have hearing impairment s/p bilateral cochlear implants in 2000.  Today: He is accompanied by a family member. For about a year to 1.5 years he has been suffering from intermittent chest  pain. He seems to describe two main types of chest pain. There is a pulsating pain that starts in the far left side of his chest (sometimes his underarm), that "wants to radiate outwards like in a pool". Typically a quick duration, 2-3 seconds. He also describes having "frequent little stings and pinches, like being stung by a bee or pulling a hair out". These are often localized inferior to his left breast line, but not always. He demonstrates the usual affected area by holding his hand over his left chest with fingers spread out in a circle. Generally his chest pain may occur randomly, such as while sitting/turning over in the bed. He states that with lifting or moving it never happens. He does a lot of cooking at home, and he notes frequent use of heavy pots and pans without reproducing the chest pain.  He has never been a smoker.  He denies any palpitations, shortness of breath, or peripheral edema. No lightheadedness, headaches, syncope, orthopnea, or PND.  Of note, due to cochlear implants he is able to have CT scans, but should not have an MRI.    Past Medical History:  Diagnosis Date   Anxiety    Bipolar disorder (HCC)    Chest pain, unspecified 03/05/2015   Depression    Hearing impairment    Hypertension    Panic attacks    Pure hypercholesterolemia 09/09/2021    Past Surgical History:  Procedure Laterality Date   INNER EAR SURGERY      Current Medications: Current Meds  Medication Sig   ALPRAZolam (XANAX) 1 MG  tablet Take 1 mg by mouth 4 (four) times daily as needed. Anxiety   amLODipine (NORVASC) 5 MG tablet Take 5 mg by mouth daily.   atorvastatin (LIPITOR) 10 MG tablet Take 10 mg by mouth daily.   cyclobenzaprine (FLEXERIL) 10 MG tablet Take 1 tablet (10 mg total) by mouth 2 (two) times daily as needed (muscle soreness).   dicyclomine (BENTYL) 20 MG tablet Take 1 tablet (20 mg total) by mouth 2 (two) times daily.   gabapentin (NEURONTIN) 100 MG capsule Take 100 mg by  mouth 3 (three) times daily.   glimepiride (AMARYL) 2 MG tablet Take 2 mg by mouth every morning.   hydrochlorothiazide (HYDRODIURIL) 25 MG tablet Take 1 tablet by mouth every morning.   meloxicam (MOBIC) 7.5 MG tablet Take 1 tablet (7.5 mg total) by mouth daily.   metoprolol tartrate (LOPRESSOR) 100 MG tablet Take 100 mg by mouth 2 (two) times daily.   ondansetron (ZOFRAN) 4 MG tablet Take 1 tablet (4 mg total) by mouth every 6 (six) hours.   Oxcarbazepine (TRILEPTAL) 300 MG tablet Take 150 mg by mouth 2 (two) times daily.    oxyCODONE-acetaminophen (PERCOCET/ROXICET) 5-325 MG tablet Take 1 tablet by mouth every 6 (six) hours as needed for severe pain.   traMADol (ULTRAM) 50 MG tablet Take 50 mg by mouth every 6 (six) hours as needed.   valsartan (DIOVAN) 320 MG tablet Take 320 mg by mouth daily.   ziprasidone (GEODON) 60 MG capsule Take 60 mg by mouth in the morning and at bedtime.   [DISCONTINUED] potassium chloride SA (KLOR-CON M) 10 MEQ tablet Take 1 tablet (10 mEq total) by mouth 2 (two) times daily for 5 days.     Allergies:   Amlodipine besylate, Depakote [divalproex sodium], Lamictal [lamotrigine], Lisinopril, Metformin hcl, and Olmesartan   Social History   Socioeconomic History   Marital status: Married    Spouse name: Not on file   Number of children: Not on file   Years of education: Not on file   Highest education level: Not on file  Occupational History   Not on file  Tobacco Use   Smoking status: Never    Passive exposure: Never   Smokeless tobacco: Never  Vaping Use   Vaping Use: Never used  Substance and Sexual Activity   Alcohol use: No   Drug use: No   Sexual activity: Yes  Other Topics Concern   Not on file  Social History Narrative   Not on file   Social Determinants of Health   Financial Resource Strain: Not on file  Food Insecurity: Not on file  Transportation Needs: Not on file  Physical Activity: Not on file  Stress: Not on file  Social  Connections: Not on file     Family History: The patient's family history includes Diabetes in his maternal grandfather and maternal grandmother; Heart attack in his maternal grandfather; Other in his mother. There is no history of Colon cancer, Colon polyps, Pancreatic cancer, Liver cancer, or Stomach cancer.  ROS:   Please see the history of present illness.    (+) Left chest pain All other systems reviewed and are negative.  EKGs/Labs/Other Studies Reviewed:    The following studies were reviewed today:  Echo TTE 03/07/2015: Study Conclusions   - Left ventricle: The cavity size was normal. Wall thickness was    normal. Systolic function was normal. The estimated ejection    fraction was in the range of 55% to 60%. Wall motion  was normal;    there were no regional wall motion abnormalities. Left    ventricular diastolic function parameters were normal.  Nuclear Stress Test 03/07/2015: There was no ST segment deviation noted during stress. The study is normal. Nuclear stress EF: 72%. The left ventricular ejection fraction is hyperdynamic (>65%).   Normal study, no evidence for ischemia or infarction.    EKG:  EKG is personally reviewed and interpreted. 10/03/2021: EKG was not ordered.   Recent Labs: 08/25/2021: ALT 44; BUN 12; Creatinine, Ser 1.02; Hemoglobin 14.8; Platelets 279; Potassium 3.2; Sodium 139; TSH 0.746   Recent Lipid Panel    Component Value Date/Time   CHOL 191 08/25/2021 2031   TRIG 327 (H) 08/25/2021 2031   HDL 38 (L) 08/25/2021 2031   CHOLHDL 5.0 08/25/2021 2031   VLDL 65 (H) 08/25/2021 2031   LDLCALC 88 08/25/2021 2031     Risk Assessment/Calculations:          Physical Exam:    VS:  BP 126/80 (BP Location: Left Arm, Patient Position: Sitting, Cuff Size: Normal)   Pulse 69   Ht 5\' 8"  (1.727 m)   Wt 284 lb (128.8 kg)   SpO2 97%   BMI 43.18 kg/m     Wt Readings from Last 3 Encounters:  10/03/21 284 lb (128.8 kg)  09/11/21 280 lb (127  kg)  08/25/21 282 lb 13.6 oz (128.3 kg)     GEN: Well nourished, well developed in no acute distress HEENT: Normal NECK: No JVD; No carotid bruits LYMPHATICS: No lymphadenopathy CARDIAC: RRR, no murmurs, rubs, gallops RESPIRATORY:  Clear to auscultation without rales, wheezing or rhonchi  ABDOMEN: Soft, non-tender, non-distended MUSCULOSKELETAL:  No edema; No deformity  SKIN: Warm and dry NEUROLOGIC:  Alert and oriented x 3 PSYCHIATRIC:  Normal affect   ASSESSMENT:    1. Pre-procedure lab exam   2. Chest pain of uncertain etiology   3. Benign essential hypertension    PLAN:    In order of problems listed above:  Chest pain of uncertain etiology We will go ahead and check a coronary CT scan.  He has never been diagnosed with coronary artery disease.  This will also help to determine if there is any calcified plaque present in which case we may need to intensify his atorvastatin and shoot for LDL goal of less than 70.  He is currently 87 on atorvastatin 10 mg once a day.  His symptomatology is fairly atypical and possibly neuropathic/musculoskeletal.  Nonetheless he has a strong family history with his father and has other risk factors.    Benign essential hypertension Overall well controlled.  No changes made to medication management.       Follow-up: TBD based on results of testing.  Medication Adjustments/Labs and Tests Ordered: Current medicines are reviewed at length with the patient today.  Concerns regarding medicines are outlined above.   Orders Placed This Encounter  Procedures   CT CORONARY MORPH W/CTA COR W/SCORE W/CA W/CM &/OR WO/CM   Basic metabolic panel   Basic metabolic panel   No orders of the defined types were placed in this encounter.  Patient Instructions  Medication Instructions:  The current medical regimen is effective;  continue present plan and medications.  *If you need a refill on your cardiac medications before your next appointment,  please call your pharmacy*  Lab Work: Please have blood work today (BMP)  If you have labs (blood work) drawn today and your tests are completely normal, you  will receive your results only by: MyChart Message (if you have MyChart) OR A paper copy in the mail If you have any lab test that is abnormal or we need to change your treatment, we will call you to review the results.   Testing/Procedures:   Your cardiac CT will be scheduled at:   [facility] [address] [place], [place] [address] [phone]  Please arrive at the [facility] and Children's Entrance (Entrance C2) of [facility] 30 minutes prior to test start time. You can use the FREE valet parking offered at entrance C (encouraged to control the heart rate for the test)  Proceed to the [facility] Radiology Department (first floor) to check-in and test prep.  All radiology patients and guests should use entrance C2 at [facility], accessed from [facility], even though the hospital's physical address listed is [address].     Please follow these instructions carefully (unless otherwise directed):  Hold all erectile dysfunction medications at least 3 days (72 hrs) prior to test.  On the Night Before the Test: Be sure to Drink plenty of water. Do not consume any caffeinated/decaffeinated beverages or chocolate 12 hours prior to your test. Do not take any antihistamines 12 hours prior to your test.  On the Day of the Test: Drink plenty of water until 1 hour prior to the test. Do not eat any food 4 hours prior to the test. You may take your regular medications prior to the test.  Take metoprolol (Lopressor) two hours prior to test. HOLD Furosemide/Hydrochlorothiazide morning of the test.  After the Test: Drink plenty of water. After receiving IV contrast, you may experience a mild flushed feeling. This is normal. On occasion, you may experience a mild rash up to  24 hours after the test. This is not dangerous. If this occurs, you can take Benadryl 25 mg and increase your fluid intake. If you experience trouble breathing, this can be serious. If it is severe call 911 IMMEDIATELY. If it is mild, please call our office. If you take any of these medications: Glipizide/Metformin, Avandament, Glucavance, please do not take 48 hours after completing test unless otherwise instructed.  We will call to schedule your test 2-4 weeks out understanding that some insurance companies will need an authorization prior to the service being performed.   For non-scheduling related questions, please contact the cardiac imaging nurse navigator should you have any questions/concerns: Rockwell Alexandria, Cardiac Imaging Nurse Navigator Larey Brick, Cardiac Imaging Nurse Navigator [facility] Direct Office Dial: [phone]   For scheduling needs, including cancellations and rescheduling, please call [place], [phone].  Follow-Up: At [facility], you and your health needs are our priority.  As part of our continuing mission to provide you with exceptional heart care, we have created designated Provider Care Teams.  These Care Teams include your primary Cardiologist (physician) and Advanced Practice Providers (APPs -  Physician Assistants and Nurse Practitioners) who all work together to provide you with the care you need, when you need it.  We recommend signing up for the patient portal called "MyChart".  Sign up information is provided on this After Visit Summary.  MyChart is used to connect with patients for Virtual Visits (Telemedicine).  Patients are able to view lab/test results, encounter notes, upcoming appointments, etc.  Non-urgent messages can be sent to your provider as well.   To learn more about what you can do with MyChart, go  to [email or web address].    Your next appointment:   Follow up will be determined after the above testing  has been completed.  Important Information About Sugar         I,Mathew Stumpf,acting as a scribe for Donato Schultz, MD.,have documented all relevant documentation on the behalf of Donato Schultz, MD,as directed by  Donato Schultz, MD while in the presence of Donato Schultz, MD.  I, Donato Schultz, MD, have reviewed all documentation for this visit. The documentation on 10/03/21 for the exam, diagnosis, procedures, and orders are all accurate and complete.   Signed, Donato Schultz, MD  10/03/2021 3:34 PM     [facility]

## 2021-10-03 NOTE — Assessment & Plan Note (Signed)
We will go ahead and check a coronary CT scan.  He has never been diagnosed with coronary artery disease.  This will also help to determine if there is any calcified plaque present in which case we may need to intensify his atorvastatin and shoot for LDL goal of less than 70.  He is currently 87 on atorvastatin 10 mg once a day.  His symptomatology is fairly atypical and possibly neuropathic/musculoskeletal.  Nonetheless he has a strong family history with his father and has other risk factors.

## 2021-10-03 NOTE — Patient Instructions (Signed)
Medication Instructions:  The current medical regimen is effective;  continue present plan and medications.  *If you need a refill on your cardiac medications before your next appointment, please call your pharmacy*  Lab Work: Please have blood work today (BMP)  If you have labs (blood work) drawn today and your tests are completely normal, you will receive your results only by: MyChart Message (if you have MyChart) OR A paper copy in the mail If you have any lab test that is abnormal or we need to change your treatment, we will call you to review the results.   Testing/Procedures:   Your cardiac CT will be scheduled at:   [facility] [address] [place], [place] [address] [phone]  Please arrive at the [facility] and Children's Entrance (Entrance C2) of [facility] 30 minutes prior to test start time. You can use the FREE valet parking offered at entrance C (encouraged to control the heart rate for the test)  Proceed to the [facility] Radiology Department (first floor) to check-in and test prep.  All radiology patients and guests should use entrance C2 at [facility], accessed from [facility], even though the hospital's physical address listed is [address].     Please follow these instructions carefully (unless otherwise directed):  Hold all erectile dysfunction medications at least 3 days (72 hrs) prior to test.  On the Night Before the Test: Be sure to Drink plenty of water. Do not consume any caffeinated/decaffeinated beverages or chocolate 12 hours prior to your test. Do not take any antihistamines 12 hours prior to your test.  On the Day of the Test: Drink plenty of water until 1 hour prior to the test. Do not eat any food 4 hours prior to the test. You may take your regular medications prior to the test.  Take metoprolol (Lopressor) two hours prior to test. HOLD Furosemide/Hydrochlorothiazide morning of  the test.  After the Test: Drink plenty of water. After receiving IV contrast, you may experience a mild flushed feeling. This is normal. On occasion, you may experience a mild rash up to 24 hours after the test. This is not dangerous. If this occurs, you can take Benadryl 25 mg and increase your fluid intake. If you experience trouble breathing, this can be serious. If it is severe call 911 IMMEDIATELY. If it is mild, please call our office. If you take any of these medications: Glipizide/Metformin, Avandament, Glucavance, please do not take 48 hours after completing test unless otherwise instructed.  We will call to schedule your test 2-4 weeks out understanding that some insurance companies will need an authorization prior to the service being performed.   For non-scheduling related questions, please contact the cardiac imaging nurse navigator should you have any questions/concerns: Darrell Nunez, Cardiac Imaging Nurse Navigator Darrell Nunez, Cardiac Imaging Nurse Navigator [place] [facility] Direct Office Dial: [phone]   For scheduling needs, including cancellations and rescheduling, please call Darrell Nunez, [phone].  Follow-Up: At [facility], you and your health needs are our priority.  As part of our continuing mission to provide you with exceptional heart care, we have created designated Provider Care Teams.  These Care Teams include your primary Cardiologist (physician) and Advanced Practice Providers (APPs -  Physician Assistants and Nurse Practitioners) who all work together to provide you with the care you need, when you need it.  We recommend signing up for the patient portal called "MyChart".  Sign  up information is provided on this After Visit Summary.  MyChart is used to connect with patients for Virtual Visits (Telemedicine).  Patients are able to view lab/test results, encounter notes, upcoming appointments, etc.  Non-urgent messages can be sent  to your provider as well.   To learn more about what you can do with MyChart, go to [email or web address].    Your next appointment:   Follow up will be determined after the above testing has been completed.  Important Information About Sugar

## 2021-10-03 NOTE — Assessment & Plan Note (Signed)
Overall well controlled.  No changes made to medication management.

## 2021-10-25 ENCOUNTER — Ambulatory Visit (HOSPITAL_COMMUNITY): Payer: Medicare HMO

## 2021-12-13 ENCOUNTER — Encounter (HOSPITAL_COMMUNITY): Payer: Self-pay

## 2022-01-02 ENCOUNTER — Telehealth: Payer: Self-pay | Admitting: Nurse Practitioner

## 2022-01-02 NOTE — Telephone Encounter (Signed)
Darrell Nunez, can you contact [name] to see if he intends to follow up with cardiologist Dr. Anne Fu as he required a cardiac clearance prior to schedule a colonoscopy due to having chest pain.  Refer to office visit  09/11/2021. He saw Dr. Anne Fu 10/03/2021 who ordered a coronary CT but looks like it was not done. I would still recommend a colonoscopy once cardiac clearance received. If he is still having lower abdominal pain, I would recommend scheduling him for a follow up office visit. THX

## 2022-01-02 NOTE — Telephone Encounter (Addendum)
Pt notified of Alcide Evener NP recommendations:  Pt stated that he went to Dr. Anne Fu and they stated that they wanted pt to have a lab drawn prior to CT. Pt stated that he has a bad fear of needles and has bad anxiety. Pt stated when he went to have labs drawn that he started to have bad vibes and the phlebotomist was very rude and had no bed side manner: Pt stated that he left and did not have labs drawn: Pt stated that he is planning on reaching out to his PCP to get a lab ordered through there office: Pt stated that he has had a lot going on in his life in regard to a recent death in the family and also   his daughter having anxiety with starting back  to school. Pt stated that he plans to have labs drawn soon and schedule CT scan: Pt was notified that if he is still having any abdominal pain then we could schedule a follow up visit: Pt stated that after he seen Jill Side the next week his pain left and the pain has not returned:  Pt verbalized understanding with all questions answered.

## 2022-02-07 DIAGNOSIS — H903 Sensorineural hearing loss, bilateral: Secondary | ICD-10-CM | POA: Diagnosis not present

## 2022-02-11 DIAGNOSIS — F4001 Agoraphobia with panic disorder: Secondary | ICD-10-CM | POA: Diagnosis not present

## 2022-02-11 DIAGNOSIS — Z79899 Other long term (current) drug therapy: Secondary | ICD-10-CM | POA: Diagnosis not present

## 2022-02-11 DIAGNOSIS — F319 Bipolar disorder, unspecified: Secondary | ICD-10-CM | POA: Diagnosis not present

## 2022-02-21 DIAGNOSIS — G4733 Obstructive sleep apnea (adult) (pediatric): Secondary | ICD-10-CM | POA: Diagnosis not present

## 2022-02-21 DIAGNOSIS — E7800 Pure hypercholesterolemia, unspecified: Secondary | ICD-10-CM | POA: Diagnosis not present

## 2022-02-21 DIAGNOSIS — R079 Chest pain, unspecified: Secondary | ICD-10-CM | POA: Diagnosis not present

## 2022-02-21 DIAGNOSIS — F319 Bipolar disorder, unspecified: Secondary | ICD-10-CM | POA: Diagnosis not present

## 2022-02-21 DIAGNOSIS — I10 Essential (primary) hypertension: Secondary | ICD-10-CM | POA: Diagnosis not present

## 2022-02-21 DIAGNOSIS — E1169 Type 2 diabetes mellitus with other specified complication: Secondary | ICD-10-CM | POA: Diagnosis not present

## 2022-04-26 DIAGNOSIS — S93491A Sprain of other ligament of right ankle, initial encounter: Secondary | ICD-10-CM | POA: Diagnosis not present

## 2022-04-26 DIAGNOSIS — S93492A Sprain of other ligament of left ankle, initial encounter: Secondary | ICD-10-CM | POA: Diagnosis not present

## 2022-05-10 ENCOUNTER — Ambulatory Visit
Admission: EM | Admit: 2022-05-10 | Discharge: 2022-05-10 | Disposition: A | Discharge status: Home / Self Care | Payer: Medicare HMO | Attending: Urgent Care | Admitting: Urgent Care

## 2022-05-10 DIAGNOSIS — B349 Viral infection, unspecified: Secondary | ICD-10-CM

## 2022-05-10 DIAGNOSIS — Z20828 Contact with and (suspected) exposure to other viral communicable diseases: Secondary | ICD-10-CM | POA: Diagnosis not present

## 2022-05-10 MED ORDER — BENZONATATE 100 MG PO CAPS: 100.0000 mg | ORAL_CAPSULE | Freq: Three times a day (TID) | ORAL | 0 refills | Status: DC | PRN

## 2022-05-10 MED ORDER — PROMETHAZINE-DM 6.25-15 MG/5ML PO SYRP: 5.0000 mL | ORAL_SOLUTION | Freq: Three times a day (TID) | ORAL | 0 refills | Status: DC | PRN

## 2022-05-10 MED ORDER — OSELTAMIVIR PHOSPHATE 75 MG PO CAPS: 75.0000 mg | ORAL_CAPSULE | Freq: Two times a day (BID) | ORAL | 0 refills | Status: DC

## 2022-05-10 NOTE — ED Triage Notes (Addendum)
Pt c/o cough,nasal congestion, body aches, sore throat day 2-last dose motrin 600mg  11am-reports +flu exposure in the home-NAD-steady gait

## 2022-05-10 NOTE — Discharge Instructions (Signed)
We will manage this as a viral syndrome like influenza with Tamiflu due to your exposure. For sore throat or cough try using a honey-based tea. Use 3 teaspoons of honey with juice squeezed from half lemon. Place shaved pieces of ginger into 1/2-1 cup of water and warm over stove top. Then mix the ingredients and repeat every 4 hours as needed. Please take Tylenol 500mg -650mg  once every 6 hours for fevers, aches and pains. Hydrate very well with at least 2 liters (64 ounces) of water. Eat light meals such as soups (chicken and noodles, chicken wild rice, vegetable).  Do not eat any foods that you are allergic to.  Start an antihistamine like Zyrtec for postnasal drainage, sinus congestion.  You can take this together with pseudoephedrine (Sudafed) at a dose of 30 mg 3 times a day or twice daily as needed for the same kind of congestion.  Use cough medications as needed.

## 2022-05-10 NOTE — ED Provider Notes (Signed)
[facility]  Note:  This document was prepared using Conservation officer, historic buildings and may include unintentional dictation errors.  MRN: [id number] DOB: 1976/03/25  Subjective:   Darrell Nunez is a 46 y.o. male presenting for 2-day history of acute onset body aches, sinus congestion, coughing, throat discomfort. Has had exposure to influenza with her wife.  She is having a very difficult time with that and would like her husband treated with Tamiflu as well.  No history of respiratory disorders.  Patient is not a smoker.  No marijuana use.  No current facility-administered medications for this encounter.  Current Outpatient Medications:    ALPRAZolam (XANAX) 1 MG tablet, Take 1 mg by mouth 4 (four) times daily as needed. Anxiety, Disp: , Rfl:    amLODipine (NORVASC) 5 MG tablet, Take 5 mg by mouth daily., Disp: , Rfl:    atorvastatin (LIPITOR) 10 MG tablet, Take 10 mg by mouth daily., Disp: , Rfl:    cyclobenzaprine (FLEXERIL) 10 MG tablet, Take 1 tablet (10 mg total) by mouth 2 (two) times daily as needed (muscle soreness)., Disp: 20 tablet, Rfl: 0   dicyclomine (BENTYL) 20 MG tablet, Take 1 tablet (20 mg total) by mouth 2 (two) times daily., Disp: 20 tablet, Rfl: 0   gabapentin (NEURONTIN) 100 MG capsule, Take 100 mg by mouth 3 (three) times daily., Disp: , Rfl:    glimepiride (AMARYL) 2 MG tablet, Take 2 mg by mouth every morning., Disp: , Rfl:    hydrochlorothiazide (HYDRODIURIL) 25 MG tablet, Take 1 tablet by mouth every morning., Disp: , Rfl:    meloxicam (MOBIC) 7.5 MG tablet, Take 1 tablet (7.5 mg total) by mouth daily., Disp: 15 tablet, Rfl: 0   metoprolol tartrate (LOPRESSOR) 100 MG tablet, Take 100 mg by mouth 2 (two) times daily., Disp: , Rfl:    ondansetron (ZOFRAN) 4 MG tablet, Take 1 tablet (4 mg total) by mouth every 6 (six) hours., Disp: 12 tablet, Rfl: 0   Oxcarbazepine (TRILEPTAL) 300 MG tablet, Take 150 mg by mouth 2 (two) times daily. ,  Disp: , Rfl:    oxyCODONE-acetaminophen (PERCOCET/ROXICET) 5-325 MG tablet, Take 1 tablet by mouth every 6 (six) hours as needed for severe pain., Disp: 15 tablet, Rfl: 0   traMADol (ULTRAM) 50 MG tablet, Take 50 mg by mouth every 6 (six) hours as needed., Disp: , Rfl:    valsartan (DIOVAN) 320 MG tablet, Take 320 mg by mouth daily., Disp: , Rfl:    ziprasidone (GEODON) 60 MG capsule, Take 60 mg by mouth in the morning and at bedtime., Disp: , Rfl:    Allergies  Allergen Reactions   Amlodipine Besylate     Other reaction(s): migraines   Depakote [Divalproex Sodium] Nausea And Vomiting   Lamictal [Lamotrigine] Nausea And Vomiting   Lisinopril     Other reaction(s): cough; dry mouth with ACEI   Metformin Hcl     Other reaction(s): personality issues   Olmesartan     Other reaction(s): dry mouth    Past Medical History:  Diagnosis Date   Anxiety    Bipolar disorder (HCC)    Chest pain, unspecified 03/05/2015   Depression    Hearing impairment    Hypertension    Panic attacks    Pure hypercholesterolemia 09/09/2021     Past Surgical History:  Procedure Laterality Date   INNER EAR SURGERY      Family History  Problem Relation Age of Onset   Other  Mother        factor five leiden   Diabetes Maternal Grandmother    Heart attack Maternal Grandfather    Diabetes Maternal Grandfather    Colon cancer Neg Hx    Colon polyps Neg Hx    Pancreatic cancer Neg Hx    Liver cancer Neg Hx    Stomach cancer Neg Hx     Social History   Tobacco Use   Smoking status: Never    Passive exposure: Never   Smokeless tobacco: Never  Vaping Use   Vaping Use: Never used  Substance Use Topics   Alcohol use: No   Drug use: No    ROS   Objective:   Vitals: BP 116/67 (BP Location: Left Arm)   Pulse 88   Temp 100 F (37.8 C) (Oral)   Resp 20   SpO2 96%   Physical Exam Constitutional:      General: He is not in acute distress.    Appearance: Normal appearance. He is  well-developed. He is obese. He is not ill-appearing, toxic-appearing or diaphoretic.  HENT:     Head: Normocephalic and atraumatic.     Right Ear: External ear normal.     Left Ear: External ear normal.     Nose: Congestion present. No rhinorrhea.     Mouth/Throat:     Mouth: Mucous membranes are moist.  Eyes:     General: No scleral icterus.       Right eye: No discharge.        Left eye: No discharge.     Extraocular Movements: Extraocular movements intact.  Cardiovascular:     Rate and Rhythm: Normal rate and regular rhythm.     Heart sounds: Normal heart sounds. No murmur heard.    No friction rub. No gallop.  Pulmonary:     Effort: Pulmonary effort is normal. No respiratory distress.     Breath sounds: Normal breath sounds. No stridor. No wheezing, rhonchi or rales.  Neurological:     Mental Status: He is alert and oriented to person, place, and time.  Psychiatric:        Mood and Affect: Mood normal.        Behavior: Behavior normal.        Thought Content: Thought content normal.     Assessment and Plan :   PDMP not reviewed this encounter.  1. Acute viral syndrome   2. Exposure to influenza     Deferred imaging given clear cardiopulmonary exam, hemodynamically stable vital signs. Will cover for influenza with Tamiflu given his exposure, symptom set, current incidence in the community.  Use supportive care, rest, fluids, hydration, light meals, schedule Tylenol and ibuprofen. Counseled patient on potential for adverse effects with medications prescribed today, patient verbalized understanding. ER and return-to-clinic precautions discussed, patient verbalized understanding.    Wallis Bamberg, PA-C 05/10/22 1356

## 2022-05-20 DIAGNOSIS — S93492A Sprain of other ligament of left ankle, initial encounter: Secondary | ICD-10-CM | POA: Diagnosis not present

## 2022-05-20 DIAGNOSIS — S93491A Sprain of other ligament of right ankle, initial encounter: Secondary | ICD-10-CM | POA: Diagnosis not present

## 2022-06-06 DIAGNOSIS — R052 Subacute cough: Secondary | ICD-10-CM | POA: Diagnosis not present

## 2022-06-06 DIAGNOSIS — G4733 Obstructive sleep apnea (adult) (pediatric): Secondary | ICD-10-CM | POA: Diagnosis not present

## 2022-06-06 DIAGNOSIS — E7800 Pure hypercholesterolemia, unspecified: Secondary | ICD-10-CM | POA: Diagnosis not present

## 2022-06-06 DIAGNOSIS — F319 Bipolar disorder, unspecified: Secondary | ICD-10-CM | POA: Diagnosis not present

## 2022-06-06 DIAGNOSIS — E1169 Type 2 diabetes mellitus with other specified complication: Secondary | ICD-10-CM | POA: Diagnosis not present

## 2022-06-06 DIAGNOSIS — I10 Essential (primary) hypertension: Secondary | ICD-10-CM | POA: Diagnosis not present

## 2022-06-18 DIAGNOSIS — F319 Bipolar disorder, unspecified: Secondary | ICD-10-CM | POA: Diagnosis not present

## 2022-06-18 DIAGNOSIS — F4001 Agoraphobia with panic disorder: Secondary | ICD-10-CM | POA: Diagnosis not present

## 2022-06-21 DIAGNOSIS — M25512 Pain in left shoulder: Secondary | ICD-10-CM | POA: Diagnosis not present

## 2022-08-07 DIAGNOSIS — M79602 Pain in left arm: Secondary | ICD-10-CM | POA: Diagnosis not present

## 2022-08-07 DIAGNOSIS — R202 Paresthesia of skin: Secondary | ICD-10-CM | POA: Diagnosis not present

## 2022-08-07 DIAGNOSIS — M79605 Pain in left leg: Secondary | ICD-10-CM | POA: Diagnosis not present

## 2022-08-07 DIAGNOSIS — M79601 Pain in right arm: Secondary | ICD-10-CM | POA: Diagnosis not present

## 2022-08-27 ENCOUNTER — Ambulatory Visit: Payer: Medicare HMO | Admitting: Neurology

## 2022-08-27 ENCOUNTER — Encounter: Payer: Self-pay | Admitting: Neurology

## 2022-08-27 VITALS — BP 137/80 | HR 82 | Ht 68.0 in | Wt 289.0 lb

## 2022-08-27 DIAGNOSIS — G5712 Meralgia paresthetica, left lower limb: Secondary | ICD-10-CM | POA: Diagnosis not present

## 2022-08-27 DIAGNOSIS — R202 Paresthesia of skin: Secondary | ICD-10-CM

## 2022-08-27 MED ORDER — GABAPENTIN 300 MG PO CAPS: ORAL_CAPSULE | ORAL | 5 refills | Status: DC

## 2022-08-27 NOTE — Patient Instructions (Addendum)
Start gabapentin 300mg  at bedtime for one week, then increase to 1 tablet twice daily.  Weight loss encouraged  Return to clinic in 4 months

## 2022-08-27 NOTE — Progress Notes (Signed)
[facility] Neurology Division Clinic Note - Initial Visit   Date: 08/27/2022   Darrell Nunez MRN: [id number] DOB: 12/25/1975   Dear Horton Marshall, PA:  Thank you for your kind referral of Darrell Nunez for consultation of numbness/tingling. Although his history is well known to you, please allow [place] to reiterate it for the purpose of our medical record. The patient was accompanied to the clinic by self.    Darrell Nunez is a 47 y.o. right-handed male with diabetes mellitus, bipolar disorder, panic attacks, hypertension, hyperlipidemia, anxiety/depression presenting for evaluation of left thigh pain.   IMPRESSION/PLAN: Left meralgia paresthetica. His examination is consistent with diminished sensation in all modalities over the anterolateral thigh on the left side. Based on history of exam, he has meralgia paresthetica an entrapment of the lateral femoral cutaneous nerve as it exits below the inguinal canal. Management is conservative with NSAIDs and avoidance of repetitive trauma to this region.  - Start neurontin 300mg  at bedtime x 1 week, then increase to 1 tablet twice daily - Ok to take meloxicam daily as needed - Weight loss encouraged ibuprofen 600mg  three times daily as needed   Bilateral hand paresthesias, most likely due to ulnar neuropathy  - NCS/EMG declined  - Avoid hyperflexion at the elbow  Return to clinic in 4 months  ------------------------------------------------------------- History of present illness: Starting in February, he began having radiating and pulsating pain over the lateral thigh, which does not extend into the knee or medial leg.  He also has tingling and numbness over the same area.  Symptoms occur daily and severe pain is alleviated with meloxicam for most of the day.  Prolonged standing and walking can aggravate the pain.  He denies wearing tight belts, recent weight gain/loss.   No associated weakness, imbalance, or falls.   No similar  problems in the right leg.   He also complains of bilateral arms falling asleep at night time.  He tends to sleep prone with arms bent under his pillow.  He wakes up several times per week with his arms falling asleep.  No weakness.   Out-side paper records, electronic medical record, and images have been reviewed where available and summarized as:  Lab Results  Component Value Date   HGBA1C 8.2 (H) 08/25/2021   No results found for: "VITAMINB12" Lab Results  Component Value Date   TSH 0.746 08/25/2021   No results found for: "ESRSEDRATE", "POCTSEDRATE"  Past Medical History:  Diagnosis Date   Anxiety    Bipolar disorder    Chest pain, unspecified 03/05/2015   Depression    Hearing impairment    Hypertension    Panic attacks    Pure hypercholesterolemia 09/09/2021    Past Surgical History:  Procedure Laterality Date   INNER EAR SURGERY       Medications:  Outpatient Encounter Medications as of 08/27/2022  Medication Sig   ALPRAZolam (XANAX) 1 MG tablet Take 1 mg by mouth 4 (four) times daily as needed. Anxiety   amLODipine (NORVASC) 5 MG tablet Take 5 mg by mouth daily.   atorvastatin (LIPITOR) 10 MG tablet Take 10 mg by mouth daily.   gabapentin (NEURONTIN) 300 MG capsule Take 1 tablet at bedtime x 1 week, then increase to 1 tablet twice daily.   glimepiride (AMARYL) 2 MG tablet Take 2 mg by mouth every morning.   hydrochlorothiazide (HYDRODIURIL) 25 MG tablet Take 1 tablet by mouth every morning.   meloxicam (MOBIC) 7.5 MG tablet Take 1 tablet (7.5 mg  total) by mouth daily.   metoprolol tartrate (LOPRESSOR) 100 MG tablet Take 100 mg by mouth 2 (two) times daily.   naproxen (NAPROSYN) 500 MG tablet Take 1 tablet by mouth 2 (two) times daily. Was advised to not take Naproxen with Meloxicam.   Oxcarbazepine (TRILEPTAL) 300 MG tablet Take 150 mg by mouth 2 (two) times daily.    OZEMPIC, 0.25 OR 0.5 MG/DOSE, 2 MG/3ML SOPN TITRATE 0.25 MG ONCE WEEKLY FOR 4 WEEKS, THEN 0.5 MG  ONCE WEEKLY FOR 2 WEEKS SUBCUTANEOUSLY   valsartan (DIOVAN) 320 MG tablet Take 320 mg by mouth daily.   ziprasidone (GEODON) 60 MG capsule Take 60 mg by mouth in the morning and at bedtime.   [DISCONTINUED] gabapentin (NEURONTIN) 100 MG capsule Take 100 mg by mouth 3 (three) times daily.   benzonatate (TESSALON) 100 MG capsule Take 1 capsule (100 mg total) by mouth 3 (three) times daily as needed for cough. (Patient not taking: Reported on 08/27/2022)   cyclobenzaprine (FLEXERIL) 10 MG tablet Take 1 tablet (10 mg total) by mouth 2 (two) times daily as needed (muscle soreness). (Patient not taking: Reported on 08/27/2022)   dicyclomine (BENTYL) 20 MG tablet Take 1 tablet (20 mg total) by mouth 2 (two) times daily. (Patient not taking: Reported on 08/27/2022)   ondansetron (ZOFRAN) 4 MG tablet Take 1 tablet (4 mg total) by mouth every 6 (six) hours. (Patient not taking: Reported on 08/27/2022)   oseltamivir (TAMIFLU) 75 MG capsule Take 1 capsule (75 mg total) by mouth 2 (two) times daily. (Patient not taking: Reported on 08/27/2022)   oxyCODONE-acetaminophen (PERCOCET/ROXICET) 5-325 MG tablet Take 1 tablet by mouth every 6 (six) hours as needed for severe pain. (Patient not taking: Reported on 08/27/2022)   promethazine-dextromethorphan (PROMETHAZINE-DM) 6.25-15 MG/5ML syrup Take 5 mLs by mouth 3 (three) times daily as needed for cough. (Patient not taking: Reported on 08/27/2022)   traMADol (ULTRAM) 50 MG tablet Take 50 mg by mouth every 6 (six) hours as needed. (Patient not taking: Reported on 08/27/2022)   No facility-administered encounter medications on file as of 08/27/2022.    Allergies:  Allergies  Allergen Reactions   Amlodipine Besylate     Other reaction(s): migraines   Depakote [Divalproex Sodium] Nausea And Vomiting   Lamictal [Lamotrigine] Nausea And Vomiting   Lisinopril     Other reaction(s): cough; dry mouth with ACEI   Metformin Hcl     Other reaction(s): personality issues    Olmesartan     Other reaction(s): dry mouth    Family History: Family History  Problem Relation Age of Onset   Other Mother        factor five leiden   Epilepsy Mother    Seizures Mother    Diabetes Maternal Grandmother    Heart disease Maternal Grandmother        Pacemaker   Heart disease Maternal Grandfather    Heart attack Maternal Grandfather    Diabetes Maternal Grandfather    Colon cancer Neg Hx    Colon polyps Neg Hx    Pancreatic cancer Neg Hx    Liver cancer Neg Hx    Stomach cancer Neg Hx     Social History: Social History   Tobacco Use   Smoking status: Never    Passive exposure: Never   Smokeless tobacco: Never  Vaping Use   Vaping Use: Never used  Substance Use Topics   Alcohol use: Yes    Comment: One drink every 2-3 months  Drug use: No   Social History   Social History Narrative   Right Handed    Lives in a one story home. Lives with wife. Has one step daughter.     Vital Signs:  BP 137/80   Pulse 82   Ht 5\' 8"  (1.727 m)   Wt 289 lb (131.1 kg)   SpO2 97%   BMI 43.94 kg/m    Neurological Exam: MENTAL STATUS including orientation to time, place, person, recent and remote memory, attention span and concentration, language, and fund of knowledge is normal.  Speech is not dysarthric.  CRANIAL NERVES: II:  No visual field defects.     III-IV-VI: Pupils equal round and reactive to light.  Normal conjugate, extra-ocular eye movements in all directions of gaze.  No nystagmus.  No ptosis.   V:  Normal facial sensation.    VII:  Normal facial symmetry and movements.   VIII:  Normal hearing and vestibular function.   IX-X:  Normal palatal movement.   XI:  Normal shoulder shrug and head rotation.   XII:  Normal tongue strength and range of motion, no deviation or fasciculation.  MOTOR:  Motor strength is 5/5 throughout. No atrophy, fasciculations or abnormal movements.  No pronator drift.   MSRs:                                           Right         Left brachioradialis 2+  2+  biceps 2+  2+  triceps 2+  2+  patellar 2+  2+  ankle jerk 2+  2+  Hoffman no  no  plantar response down  down   SENSORY:  Reduced pin prick and temperature over the left lateral femoral cutaneous nerve.  Normal and symmetric perception of light touch, pinprick, vibration, and temperature in the feet.    COORDINATION/GAIT: Normal finger-to- nose-finger.  Intact rapid alternating movements bilaterally. Gait narrow based and stable. Tandem and stressed gait intact.     Thank you for allowing me to participate in patient's care.  If I can answer any additional questions, I would be pleased to do so.    Sincerely,    Muriel Hannold K. Allena Katz, DO

## 2022-08-29 DIAGNOSIS — E7800 Pure hypercholesterolemia, unspecified: Secondary | ICD-10-CM | POA: Diagnosis not present

## 2022-08-29 DIAGNOSIS — F319 Bipolar disorder, unspecified: Secondary | ICD-10-CM | POA: Diagnosis not present

## 2022-08-29 DIAGNOSIS — I10 Essential (primary) hypertension: Secondary | ICD-10-CM | POA: Diagnosis not present

## 2022-08-29 DIAGNOSIS — E1165 Type 2 diabetes mellitus with hyperglycemia: Secondary | ICD-10-CM | POA: Diagnosis not present

## 2022-08-29 DIAGNOSIS — G4733 Obstructive sleep apnea (adult) (pediatric): Secondary | ICD-10-CM | POA: Diagnosis not present

## 2022-08-29 DIAGNOSIS — Z6841 Body Mass Index (BMI) 40.0 and over, adult: Secondary | ICD-10-CM | POA: Diagnosis not present

## 2022-09-05 DIAGNOSIS — E1169 Type 2 diabetes mellitus with other specified complication: Secondary | ICD-10-CM | POA: Diagnosis not present

## 2022-09-18 DIAGNOSIS — G5622 Lesion of ulnar nerve, left upper limb: Secondary | ICD-10-CM | POA: Diagnosis not present

## 2022-09-18 DIAGNOSIS — M546 Pain in thoracic spine: Secondary | ICD-10-CM | POA: Diagnosis not present

## 2022-09-18 DIAGNOSIS — G5621 Lesion of ulnar nerve, right upper limb: Secondary | ICD-10-CM | POA: Diagnosis not present

## 2022-09-18 DIAGNOSIS — M5412 Radiculopathy, cervical region: Secondary | ICD-10-CM | POA: Diagnosis not present

## 2022-09-18 DIAGNOSIS — Z6841 Body Mass Index (BMI) 40.0 and over, adult: Secondary | ICD-10-CM | POA: Diagnosis not present

## 2022-09-29 DIAGNOSIS — G4733 Obstructive sleep apnea (adult) (pediatric): Secondary | ICD-10-CM | POA: Diagnosis not present

## 2022-09-29 DIAGNOSIS — I10 Essential (primary) hypertension: Secondary | ICD-10-CM | POA: Diagnosis not present

## 2022-10-27 DIAGNOSIS — G4733 Obstructive sleep apnea (adult) (pediatric): Secondary | ICD-10-CM | POA: Diagnosis not present

## 2022-11-03 DIAGNOSIS — M4722 Other spondylosis with radiculopathy, cervical region: Secondary | ICD-10-CM | POA: Diagnosis not present

## 2022-11-03 DIAGNOSIS — G5601 Carpal tunnel syndrome, right upper limb: Secondary | ICD-10-CM | POA: Diagnosis not present

## 2022-11-03 DIAGNOSIS — M25511 Pain in right shoulder: Secondary | ICD-10-CM | POA: Diagnosis not present

## 2022-11-10 DIAGNOSIS — K027 Dental root caries: Secondary | ICD-10-CM | POA: Diagnosis not present

## 2022-11-10 DIAGNOSIS — K089 Disorder of teeth and supporting structures, unspecified: Secondary | ICD-10-CM | POA: Diagnosis not present

## 2022-11-21 DIAGNOSIS — K0889 Other specified disorders of teeth and supporting structures: Secondary | ICD-10-CM | POA: Diagnosis not present

## 2022-11-21 DIAGNOSIS — Z760 Encounter for issue of repeat prescription: Secondary | ICD-10-CM | POA: Diagnosis not present

## 2022-12-22 DIAGNOSIS — G5602 Carpal tunnel syndrome, left upper limb: Secondary | ICD-10-CM | POA: Diagnosis not present

## 2022-12-22 DIAGNOSIS — G5601 Carpal tunnel syndrome, right upper limb: Secondary | ICD-10-CM | POA: Diagnosis not present

## 2023-01-06 ENCOUNTER — Ambulatory Visit: Payer: Medicare HMO | Admitting: Neurology

## 2023-04-07 ENCOUNTER — Telehealth: Payer: Self-pay | Admitting: Family Medicine

## 2023-04-07 DIAGNOSIS — Z45321 Encounter for adjustment and management of cochlear device: Secondary | ICD-10-CM | POA: Diagnosis not present

## 2023-04-07 DIAGNOSIS — H903 Sensorineural hearing loss, bilateral: Secondary | ICD-10-CM | POA: Diagnosis not present

## 2023-04-07 NOTE — Telephone Encounter (Signed)
Pt's spouse called and wanted to ask if you could approve moving his appt to 3pm so his appt can be B2B w/ his wife, Aftab Nesseth. Please advise

## 2023-04-10 ENCOUNTER — Ambulatory Visit (INDEPENDENT_AMBULATORY_CARE_PROVIDER_SITE_OTHER): Payer: Medicare HMO | Admitting: Physician Assistant

## 2023-04-10 ENCOUNTER — Encounter: Payer: Self-pay | Admitting: Physician Assistant

## 2023-04-10 VITALS — BP 132/89 | HR 65 | Temp 98.3°F | Ht 68.0 in | Wt 304.1 lb

## 2023-04-10 DIAGNOSIS — E1159 Type 2 diabetes mellitus with other circulatory complications: Secondary | ICD-10-CM | POA: Diagnosis not present

## 2023-04-10 DIAGNOSIS — R7989 Other specified abnormal findings of blood chemistry: Secondary | ICD-10-CM

## 2023-04-10 DIAGNOSIS — E785 Hyperlipidemia, unspecified: Secondary | ICD-10-CM

## 2023-04-10 DIAGNOSIS — F3132 Bipolar disorder, current episode depressed, moderate: Secondary | ICD-10-CM | POA: Diagnosis not present

## 2023-04-10 DIAGNOSIS — I152 Hypertension secondary to endocrine disorders: Secondary | ICD-10-CM | POA: Diagnosis not present

## 2023-04-10 DIAGNOSIS — E1169 Type 2 diabetes mellitus with other specified complication: Secondary | ICD-10-CM | POA: Diagnosis not present

## 2023-04-10 DIAGNOSIS — F418 Other specified anxiety disorders: Secondary | ICD-10-CM

## 2023-04-10 DIAGNOSIS — Z7985 Long-term (current) use of injectable non-insulin antidiabetic drugs: Secondary | ICD-10-CM | POA: Diagnosis not present

## 2023-04-10 NOTE — Progress Notes (Unsigned)
New patient visit   Patient: Darrell Nunez   DOB: 1976-05-19   47 y.o. Male  MRN: [id number] Visit Date: 04/10/2023  Today's healthcare provider: Alfredia Ferguson, PA-C   No chief complaint on file.  Subjective    Darrell Nunez is a 47 y.o. male who presents today as a new patient to establish care.   Htn- amlo 5 hydrochlorothiazide 25. Metop 100 bid cad? Valsartan 320 Hld- lipitor 10 Neuropathy-- gaba 300 q ?  Dm -- glimepiride 2 mg  Off ozempic dose ? Falure 2/2 cost, donut hole 1 mg dose too high  Bipolar-- trilepta 150 bid geodon 60 bid  Cochelar unc  Last ldl 08/2021 88 high trigs Sometimes 3 ibuprofen at night  A1c 08/2021 8.2  Past Medical History:  Diagnosis Date   Anxiety    Bipolar disorder (HCC)    Chest pain, unspecified 03/05/2015   Depression    Hearing impairment    Hypertension    Panic attacks    Pure hypercholesterolemia 09/09/2021   Past Surgical History:  Procedure Laterality Date   INNER EAR SURGERY     Family Status  Relation Name Status   Mother  Alive   Father  Deceased   MGM  Deceased   MGF  Deceased   Neg Hx  (Not Specified)  No partnership data on file   Family History  Problem Relation Age of Onset   Other Mother        factor five leiden   Epilepsy Mother    Seizures Mother    Diabetes Maternal Grandmother    Heart disease Maternal Grandmother        Pacemaker   Heart disease Maternal Grandfather    Heart attack Maternal Grandfather    Diabetes Maternal Grandfather    Colon cancer Neg Hx    Colon polyps Neg Hx    Pancreatic cancer Neg Hx    Liver cancer Neg Hx    Stomach cancer Neg Hx    Social History   Socioeconomic History   Marital status: Married    Spouse name: Not on file   Number of children: 0   Years of education: Not on file   Highest education level: Not on file  Occupational History   Not on file  Tobacco Use   Smoking status: Never    Passive exposure: Never   Smokeless tobacco: Never   Vaping Use   Vaping status: Never Used  Substance and Sexual Activity   Alcohol use: Yes    Comment: One drink every 2-3 months   Drug use: No   Sexual activity: Yes  Other Topics Concern   Not on file  Social History Narrative   Right Handed    Lives in a one story home. Lives with wife. Has one step daughter.    Social Determinants of Health   Financial Resource Strain: Not on file  Food Insecurity: Not on file  Transportation Needs: Not on file  Physical Activity: Not on file  Stress: Not on file  Social Connections: Not on file   Outpatient Medications Prior to Visit  Medication Sig   ALPRAZolam (XANAX) 1 MG tablet Take 1 mg by mouth 4 (four) times daily as needed. Anxiety   amLODipine (NORVASC) 5 MG tablet Take 5 mg by mouth daily.   atorvastatin (LIPITOR) 10 MG tablet Take 10 mg by mouth daily.   benzonatate (TESSALON) 100 MG capsule Take 1 capsule (100 mg total) by mouth 3 (three)  times daily as needed for cough. (Patient not taking: Reported on 08/27/2022)   cyclobenzaprine (FLEXERIL) 10 MG tablet Take 1 tablet (10 mg total) by mouth 2 (two) times daily as needed (muscle soreness). (Patient not taking: Reported on 08/27/2022)   dicyclomine (BENTYL) 20 MG tablet Take 1 tablet (20 mg total) by mouth 2 (two) times daily. (Patient not taking: Reported on 08/27/2022)   gabapentin (NEURONTIN) 300 MG capsule Take 1 tablet at bedtime x 1 week, then increase to 1 tablet twice daily.   glimepiride (AMARYL) 2 MG tablet Take 2 mg by mouth every morning.   hydrochlorothiazide (HYDRODIURIL) 25 MG tablet Take 1 tablet by mouth every morning.   meloxicam (MOBIC) 7.5 MG tablet Take 1 tablet (7.5 mg total) by mouth daily.   metoprolol tartrate (LOPRESSOR) 100 MG tablet Take 100 mg by mouth 2 (two) times daily.   naproxen (NAPROSYN) 500 MG tablet Take 1 tablet by mouth 2 (two) times daily. Was advised to not take Naproxen with Meloxicam.   ondansetron (ZOFRAN) 4 MG tablet Take 1 tablet (4 mg  total) by mouth every 6 (six) hours. (Patient not taking: Reported on 08/27/2022)   oseltamivir (TAMIFLU) 75 MG capsule Take 1 capsule (75 mg total) by mouth 2 (two) times daily. (Patient not taking: Reported on 08/27/2022)   Oxcarbazepine (TRILEPTAL) 300 MG tablet Take 150 mg by mouth 2 (two) times daily.    oxyCODONE-acetaminophen (PERCOCET/ROXICET) 5-325 MG tablet Take 1 tablet by mouth every 6 (six) hours as needed for severe pain. (Patient not taking: Reported on 08/27/2022)   OZEMPIC, 0.25 OR 0.5 MG/DOSE, 2 MG/3ML SOPN TITRATE 0.25 MG ONCE WEEKLY FOR 4 WEEKS, THEN 0.5 MG ONCE WEEKLY FOR 2 WEEKS SUBCUTANEOUSLY   promethazine-dextromethorphan (PROMETHAZINE-DM) 6.25-15 MG/5ML syrup Take 5 mLs by mouth 3 (three) times daily as needed for cough. (Patient not taking: Reported on 08/27/2022)   traMADol (ULTRAM) 50 MG tablet Take 50 mg by mouth every 6 (six) hours as needed. (Patient not taking: Reported on 08/27/2022)   valsartan (DIOVAN) 320 MG tablet Take 320 mg by mouth daily.   ziprasidone (GEODON) 60 MG capsule Take 60 mg by mouth in the morning and at bedtime.   No facility-administered medications prior to visit.   Allergies  Allergen Reactions   Amlodipine Besylate     Other reaction(s): migraines   Depakote [Divalproex Sodium] Nausea And Vomiting   Lamictal [Lamotrigine] Nausea And Vomiting   Lisinopril     Other reaction(s): cough; dry mouth with ACEI   Metformin Hcl     Other reaction(s): personality issues   Olmesartan     Other reaction(s): dry mouth     There is no immunization history on file for this patient.  Health Maintenance  Topic Date Due   FOOT EXAM  Never done   OPHTHALMOLOGY EXAM  Never done   HIV Screening  Never done   Diabetic kidney evaluation - Urine ACR  Never done   Hepatitis C Screening  Never done   DTaP/Tdap/Td (1 - Tdap) Never done   Medicare Annual Wellness (AWV)  02/05/2017   Colonoscopy  Never done   HEMOGLOBIN A1C  02/24/2022   Diabetic kidney  evaluation - eGFR measurement  08/26/2022   INFLUENZA VACCINE  Never done   COVID-19 Vaccine (1 - 2023-24 season) Never done   HPV VACCINES  Aged Out    Patient Care Team: Wilfrid Lund, PA as PCP - General (Family Medicine) Glendale Chard, DO as Consulting Physician (  Neurology)  Review of Systems  {Insert previous labs (optional):23779} {See past labs  Heme  Chem  Endocrine  Serology  Results Review (optional):1}   Objective    There were no vitals taken for this visit. {Insert last BP/Wt (optional):23777}{See vitals history (optional):1}   Physical Exam Constitutional:      General: He is awake.     Appearance: He is well-developed. He is obese.  HENT:     Head: Normocephalic.  Eyes:     Conjunctiva/sclera: Conjunctivae normal.  Cardiovascular:     Rate and Rhythm: Normal rate and regular rhythm.     Heart sounds: Normal heart sounds.  Pulmonary:     Effort: Pulmonary effort is normal.     Breath sounds: Normal breath sounds.  Skin:    General: Skin is warm.  Neurological:     Mental Status: He is alert and oriented to person, place, and time.  Psychiatric:        Attention and Perception: Attention normal.        Mood and Affect: Mood normal.        Speech: Speech normal.        Behavior: Behavior is cooperative.    ***  Depression Screen     No data to display         No results found for any visits on 04/10/23.  Assessment & Plan     There are no diagnoses linked to this encounter.   No follow-ups on file.      Alfredia Ferguson, PA-C  [facility] [phone] (phone) [phone] (fax)  [facility]

## 2023-04-13 ENCOUNTER — Encounter: Payer: Self-pay | Admitting: Physician Assistant

## 2023-04-13 DIAGNOSIS — H9193 Unspecified hearing loss, bilateral: Secondary | ICD-10-CM | POA: Insufficient documentation

## 2023-04-13 DIAGNOSIS — F418 Other specified anxiety disorders: Secondary | ICD-10-CM | POA: Insufficient documentation

## 2023-04-13 DIAGNOSIS — R7989 Other specified abnormal findings of blood chemistry: Secondary | ICD-10-CM | POA: Insufficient documentation

## 2023-04-13 NOTE — Assessment & Plan Note (Addendum)
Chronic, moderately controlled. Manages with amlodipine 5 mg hydrochlorothiazide 25 mg valsartan 320 mg metoprolol 100 mg bid Ordered cmp F/u 6 mo

## 2023-04-13 NOTE — Assessment & Plan Note (Signed)
Will obtain prior records, ordered fasting lipids/cmp Manages with atorvastatin 10 mg.

## 2023-04-13 NOTE — Assessment & Plan Note (Signed)
Managed by psychiatry 

## 2023-04-13 NOTE — Assessment & Plan Note (Signed)
" >>  ASSESSMENT AND PLAN FOR TYPE 2 DIABETES MELLITUS WITH OTHER SPECIFIED COMPLICATION (HCC) WRITTEN ON 04/13/2023 10:43 AM BY Mccartney Chuba, PA-C  Last A1c per pt < 6%. Will obtain records Currently managing with glimepiride  2 mg. Was on ozempic  0.25 mg ,but pt unable to afford. Will watch out for samples, and can refer to pharmacy for assistance.  Pt w/ DM neuropathy, gabapentin  300 mg bid.  Ordered uacr. Next visit will need foot exam. Pt reports utd on optho, on statin, arb. F/u pending ordered a1c "

## 2023-04-13 NOTE — Assessment & Plan Note (Signed)
Historically, will repeat tsh/t4

## 2023-04-13 NOTE — Assessment & Plan Note (Signed)
Pt explains his history with significant medical anxiety, including blood draws. Pt needs to take a xanax before a blood draw, accompanied by his wife.

## 2023-04-13 NOTE — Assessment & Plan Note (Signed)
Last A1c per pt < 6%. Will obtain records Currently managing with glimepiride 2 mg. Was on ozempic 0.25 mg ,but pt unable to afford. Will watch out for samples, and can refer to pharmacy for assistance.  Pt w/ DM neuropathy, gabapentin 300 mg bid.  Ordered uacr. Next visit will need foot exam. Pt reports utd on optho, on statin, arb. F/u pending ordered a1c

## 2023-05-08 ENCOUNTER — Other Ambulatory Visit: Payer: Self-pay

## 2023-05-08 ENCOUNTER — Telehealth: Payer: Self-pay

## 2023-05-08 DIAGNOSIS — E1159 Type 2 diabetes mellitus with other circulatory complications: Secondary | ICD-10-CM

## 2023-05-08 DIAGNOSIS — E1169 Type 2 diabetes mellitus with other specified complication: Secondary | ICD-10-CM

## 2023-05-08 MED ORDER — HYDROCHLOROTHIAZIDE 25 MG PO TABS: 25.0000 mg | ORAL_TABLET | Freq: Every morning | ORAL | 0 refills | Status: DC

## 2023-05-08 MED ORDER — ATORVASTATIN CALCIUM 10 MG PO TABS: 10.0000 mg | ORAL_TABLET | Freq: Every day | ORAL | 0 refills | Status: DC

## 2023-05-08 NOTE — Telephone Encounter (Signed)
Sent in 3 month for patient---- patient suppose to follow up in march

## 2023-05-08 NOTE — Telephone Encounter (Signed)
Copied from CRM [id number]. Topic: Clinical - Medication Refill >> May 08, 2023  9:21 AM Benetta Spar A wrote: Most Recent Primary Care Visit:  Provider: Alfredia Ferguson  Department: [facility]  Visit Type: NEW PATIENT  Date: 04/10/2023  Medication: atorvastatin (LIPITOR) 10 MG tablet and hydrochlorothiazide (HYDRODIURIL) 25 MG tablet  Has the patient contacted their pharmacy? Yes (Agent: If no, request that the patient contact the pharmacy for the refill. If patient does not wish to contact the pharmacy document the reason why and proceed with request.) (Agent: If yes, when and what did the pharmacy advise?)  Is this the correct pharmacy for this prescription? Yes If no, delete pharmacy and type the correct one.  This is the patient's preferred pharmacy:  [facility] [address] [place], [place] - [address] [place] [address] Phone: [phone] Fax: [phone]   Has the prescription been filled recently? No  Is the patient out of the medication? No  Has the patient been seen for an appointment in the last year OR does the patient have an upcoming appointment? Yes  Can we respond through MyChart? Yes  Agent: Please be advised that Rx refills may take up to 3 business days. We ask that you follow-up with your pharmacy.

## 2023-06-09 ENCOUNTER — Ambulatory Visit: Payer: Self-pay | Admitting: Physician Assistant

## 2023-06-09 NOTE — Telephone Encounter (Signed)
Pt calling in to report an "episode" he experienced on Monday around 0200. He states he got out of bed and went to the kitchen to sit at the table, he reports he took 2 sips of OJ and noted on the second drink that the liquid felt stuck and his throat felt closed off. He reports feeling like he couldn't breathe, his eyes closed and he began shaking from the chest up, he reports this entire episode lasted about 5 seconds. He states he remembers it all, denies LOC, dizziness, confusion, any current symptoms. Pt reports he recalls this happened one other time in 2022. Pt does not know what may be causing this. He reports no history of seizure disorder. Pt had appt scheduled for Thursday 01/23. This RN educated pt on new-worsening symptoms, when to call back. Pt verbalized understanding and agrees to plan.  Copied from CRM [id number]. Topic: Clinical - Medical Advice >> Jun 09, 2023  3:35 PM Florestine Avers wrote: Reason for CRM: Patient called in stating he would like a nurse to call him so he can ask a medica question. Would not disclose what it was. Reason for Disposition  Nursing judgment  Protocols used: No Guideline or Reference Available-[facility]

## 2023-06-10 NOTE — Telephone Encounter (Signed)
Called and left detailed message on VM. Let patient know to check blood sugar if happens again today  If he has any questions to give [place] a call

## 2023-06-11 ENCOUNTER — Other Ambulatory Visit: Payer: Medicare HMO

## 2023-06-11 ENCOUNTER — Encounter: Payer: Self-pay | Admitting: Physician Assistant

## 2023-06-11 ENCOUNTER — Ambulatory Visit: Payer: Medicare HMO | Admitting: Physician Assistant

## 2023-06-11 VITALS — BP 139/87 | HR 68 | Temp 98.1°F | Ht 68.0 in | Wt 300.4 lb

## 2023-06-11 DIAGNOSIS — Z5181 Encounter for therapeutic drug level monitoring: Secondary | ICD-10-CM | POA: Diagnosis not present

## 2023-06-11 DIAGNOSIS — R251 Tremor, unspecified: Secondary | ICD-10-CM | POA: Diagnosis not present

## 2023-06-11 MED ORDER — OZEMPIC (0.25 OR 0.5 MG/DOSE) 2 MG/3ML ~~LOC~~ SOPN: PEN_INJECTOR | SUBCUTANEOUS | Status: DC

## 2023-06-11 NOTE — Progress Notes (Signed)
Established patient visit   Patient: Darrell Nunez   DOB: 1976-03-31   48 y.o. Male  MRN: [id number] Visit Date: 06/11/2023  Today's healthcare provider: Alfredia Ferguson, PA-C   Chief Complaint  Patient presents with   Diabetes    First happened in '22 then again the other day.(Sunday) First felt like he couldn't breathe, then will get the "shakes" Shakes only last about 3 seconds. No sweats- doesn't notice a faster heart rate either.    **patient would like to start getting prescriptions sent in for 90 days**   Subjective     Discussed the use of AI scribe software for clinical note transcription with the patient, who gave verbal consent to proceed.  History of Present Illness   The patient, with PMH of DM, HTN, HLD, fatty liver, bipolar disorder, presents with an episode of shaking and breathlessness. Pt reports this weekend, around 2-3 am, he was up, went to the fridge to get some orange juice, and had an episode he describes--  by a sensation of the breathing being cut off at the top of the throat, involuntary shaking, and temporary closure of the eyes. Reports it lasted 2-3 seconds. He recalls one other episode, maybe in 2022, where the same thing happened, lasting around 5-6 seconds. Reports one random blood sugar this week of 125. Otherwise he is not checking his blood sugar.      Medications: Outpatient Medications Prior to Visit  Medication Sig   ALPRAZolam (XANAX) 1 MG tablet Take 1 mg by mouth 4 (four) times daily as needed. Anxiety   amLODipine (NORVASC) 5 MG tablet Take 5 mg by mouth daily.   atorvastatin (LIPITOR) 10 MG tablet Take 1 tablet (10 mg total) by mouth daily.   gabapentin (NEURONTIN) 300 MG capsule Take 1 tablet at bedtime x 1 week, then increase to 1 tablet twice daily.   glimepiride (AMARYL) 2 MG tablet Take 2 mg by mouth every morning.   hydrochlorothiazide (HYDRODIURIL) 25 MG tablet Take 1 tablet (25 mg total) by mouth every morning.    metoprolol tartrate (LOPRESSOR) 100 MG tablet Take 100 mg by mouth 2 (two) times daily.   naproxen (NAPROSYN) 500 MG tablet Take 1 tablet by mouth 2 (two) times daily. Was advised to not take Naproxen with Meloxicam.   Oxcarbazepine (TRILEPTAL) 300 MG tablet Take 150 mg by mouth 2 (two) times daily.    OZEMPIC, 0.25 OR 0.5 MG/DOSE, 2 MG/3ML SOPN TITRATE 0.25 MG ONCE WEEKLY FOR 4 WEEKS, THEN 0.5 MG ONCE WEEKLY FOR 2 WEEKS SUBCUTANEOUSLY   pioglitazone (ACTOS) 30 MG tablet Take 30 mg by mouth daily.   valsartan (DIOVAN) 320 MG tablet Take 320 mg by mouth daily.   ziprasidone (GEODON) 60 MG capsule Take 60 mg by mouth in the morning and at bedtime.   No facility-administered medications prior to visit.    Review of Systems  Constitutional:  Negative for fatigue and fever.  Respiratory:  Negative for cough and shortness of breath.   Cardiovascular:  Negative for chest pain, palpitations and leg swelling.  Neurological:  Positive for tremors. Negative for dizziness and headaches.       Objective    BP 139/87   Pulse 68   Temp 98.1 F (36.7 C) (Oral)   Ht 5\' 8"  (1.727 m)   Wt (!) 300 lb 6 oz (136.2 kg)   SpO2 97%   BMI 45.67 kg/m    Physical Exam Constitutional:  General: He is awake.     Appearance: He is well-developed.  HENT:     Head: Normocephalic.  Eyes:     Conjunctiva/sclera: Conjunctivae normal.  Cardiovascular:     Rate and Rhythm: Normal rate and regular rhythm.     Heart sounds: Normal heart sounds.  Pulmonary:     Effort: Pulmonary effort is normal.     Breath sounds: Normal breath sounds.  Skin:    General: Skin is warm.  Neurological:     Mental Status: He is alert and oriented to person, place, and time.  Psychiatric:        Attention and Perception: Attention normal.        Mood and Affect: Mood normal.        Speech: Speech normal.        Behavior: Behavior is cooperative.     No results found for any visits on 06/11/23.  Assessment & Plan     Shakiness -     10-Hydroxycarbazepine  Therapeutic drug monitoring -     10-Hydroxycarbazepine  Other orders -     Ozempic (0.25 or 0.5 MG/DOSE); Injection 0.25 mg into skin every week     No recent lab work. Discussed need for lab work to narrow ddx. Still pended from last visit. Adding trileptal level. Ddx as of now: hypoglycemia, hyperglycemia, electrolyte abnormality (h/o of hypokalemia), cardiac arrhythmia, seizure, TIA. Applied a freestyle libre today, and set up his app to track his sugars-- to see if he is going hypoglycemic at night. If another episode occurs, highly suggest checking blood sugar at that time.  - Schedule follow-up in two weeks to review lab results and glucose monitor data  Gave sample of ozempic 0.25 mg today.  Return in about 2 weeks (around 06/25/2023) for DMII.       Alfredia Ferguson, PA-C  [facility] [phone] (phone) [phone] (fax)  [facility]

## 2023-06-12 ENCOUNTER — Other Ambulatory Visit (INDEPENDENT_AMBULATORY_CARE_PROVIDER_SITE_OTHER): Payer: Medicare HMO

## 2023-06-12 DIAGNOSIS — E785 Hyperlipidemia, unspecified: Secondary | ICD-10-CM | POA: Diagnosis not present

## 2023-06-12 DIAGNOSIS — R7989 Other specified abnormal findings of blood chemistry: Secondary | ICD-10-CM

## 2023-06-12 DIAGNOSIS — E1169 Type 2 diabetes mellitus with other specified complication: Secondary | ICD-10-CM

## 2023-06-12 DIAGNOSIS — Z5181 Encounter for therapeutic drug level monitoring: Secondary | ICD-10-CM | POA: Diagnosis not present

## 2023-06-12 DIAGNOSIS — I152 Hypertension secondary to endocrine disorders: Secondary | ICD-10-CM

## 2023-06-12 DIAGNOSIS — R251 Tremor, unspecified: Secondary | ICD-10-CM

## 2023-06-12 DIAGNOSIS — E1159 Type 2 diabetes mellitus with other circulatory complications: Secondary | ICD-10-CM | POA: Diagnosis not present

## 2023-06-12 NOTE — Addendum Note (Signed)
Addended by: Mervin Kung A on: 06/12/2023 03:45 PM   Modules accepted: Orders

## 2023-06-12 NOTE — Addendum Note (Signed)
Addended by: Mervin Kung A on: 06/12/2023 04:21 PM   Modules accepted: Orders

## 2023-06-15 ENCOUNTER — Other Ambulatory Visit: Payer: Self-pay | Admitting: Physician Assistant

## 2023-06-15 ENCOUNTER — Encounter: Payer: Self-pay | Admitting: Physician Assistant

## 2023-06-15 DIAGNOSIS — E876 Hypokalemia: Secondary | ICD-10-CM

## 2023-06-16 ENCOUNTER — Telehealth: Payer: Self-pay

## 2023-06-16 LAB — CBC WITH DIFFERENTIAL/PLATELET
Absolute Lymphocytes: 3315 {cells}/uL (ref 850–3900)
Absolute Monocytes: 757 {cells}/uL (ref 200–950)
Basophils Absolute: 44 {cells}/uL (ref 0–200)
Basophils Relative: 0.5 %
Eosinophils Absolute: 78 {cells}/uL (ref 15–500)
Eosinophils Relative: 0.9 %
HCT: 42.4 % (ref 38.5–50.0)
Hemoglobin: 14.4 g/dL (ref 13.2–17.1)
MCH: 29.5 pg (ref 27.0–33.0)
MCHC: 34 g/dL (ref 32.0–36.0)
MCV: 86.9 fL (ref 80.0–100.0)
MPV: 10.3 fL (ref 7.5–12.5)
Monocytes Relative: 8.7 %
Neutro Abs: 4507 {cells}/uL (ref 1500–7800)
Neutrophils Relative %: 51.8 %
Platelets: 265 10*3/uL (ref 140–400)
RBC: 4.88 10*6/uL (ref 4.20–5.80)
RDW: 14.1 % (ref 11.0–15.0)
Total Lymphocyte: 38.1 %
WBC: 8.7 10*3/uL (ref 3.8–10.8)

## 2023-06-16 LAB — HEMOGLOBIN A1C
Hgb A1c MFr Bld: 6.4 %{Hb} — ABNORMAL HIGH (ref <5.7)
Mean Plasma Glucose: 137 mg/dL
eAG (mmol/L): 7.6 mmol/L

## 2023-06-16 LAB — COMPREHENSIVE METABOLIC PANEL
AG Ratio: 1.9 (calc) (ref 1.0–2.5)
ALT: 25 U/L (ref 9–46)
AST: 17 U/L (ref 10–40)
Albumin: 4.5 g/dL (ref 3.6–5.1)
Alkaline phosphatase (APISO): 103 U/L (ref 36–130)
BUN/Creatinine Ratio: 14 (calc) (ref 6–22)
BUN: 18 mg/dL (ref 7–25)
CO2: 26 mmol/L (ref 20–32)
Calcium: 9.4 mg/dL (ref 8.6–10.3)
Chloride: 101 mmol/L (ref 98–110)
Creat: 1.3 mg/dL — ABNORMAL HIGH (ref 0.60–1.29)
Globulin: 2.4 g/dL (ref 1.9–3.7)
Glucose, Bld: 114 mg/dL — ABNORMAL HIGH (ref 65–99)
Potassium: 3.4 mmol/L — ABNORMAL LOW (ref 3.5–5.3)
Sodium: 140 mmol/L (ref 135–146)
Total Bilirubin: 0.3 mg/dL (ref 0.2–1.2)
Total Protein: 6.9 g/dL (ref 6.1–8.1)

## 2023-06-16 LAB — LIPID PANEL
Cholesterol: 186 mg/dL (ref <200)
HDL: 44 mg/dL (ref >=40)
LDL Cholesterol (Calc): 114 mg/dL — ABNORMAL HIGH
Non-HDL Cholesterol (Calc): 142 mg/dL — ABNORMAL HIGH (ref <130)
Total CHOL/HDL Ratio: 4.2 (calc) (ref <5.0)
Triglycerides: 163 mg/dL — ABNORMAL HIGH (ref <150)

## 2023-06-16 LAB — T4, FREE: Free T4: 0.9 ng/dL (ref 0.8–1.8)

## 2023-06-16 LAB — TSH: TSH: 1.04 m[IU]/L (ref 0.40–4.50)

## 2023-06-16 LAB — 10-HYDROXYCARBAZEPINE: Triliptal/MTB(Oxcarbazepin): 17.2 ug/mL (ref 8.0–35.0)

## 2023-06-16 NOTE — Telephone Encounter (Signed)
Copied from CRM [id number]. Topic: Clinical - Prescription Issue >> Jun 16, 2023  2:28 PM Theodis Sato wrote: Reason for CRM: Freestyle libre 3 fell of him arm on Saturday and no longer works and is requesting to know if he can come to the office in order to have another one put on or wait until his next appointment. Please call patient back and advise.

## 2023-06-17 NOTE — Telephone Encounter (Signed)
Sent pt a mychart message to make appt for reapplication

## 2023-06-18 DIAGNOSIS — Z5181 Encounter for therapeutic drug level monitoring: Secondary | ICD-10-CM | POA: Diagnosis not present

## 2023-06-18 DIAGNOSIS — F319 Bipolar disorder, unspecified: Secondary | ICD-10-CM | POA: Diagnosis not present

## 2023-06-18 DIAGNOSIS — F4001 Agoraphobia with panic disorder: Secondary | ICD-10-CM | POA: Diagnosis not present

## 2023-06-18 DIAGNOSIS — F29 Unspecified psychosis not due to a substance or known physiological condition: Secondary | ICD-10-CM | POA: Diagnosis not present

## 2023-06-25 ENCOUNTER — Ambulatory Visit (INDEPENDENT_AMBULATORY_CARE_PROVIDER_SITE_OTHER): Payer: Medicare HMO | Admitting: Physician Assistant

## 2023-06-25 VITALS — BP 119/77 | HR 73 | Temp 98.0°F | Ht 68.0 in | Wt 296.5 lb

## 2023-06-25 DIAGNOSIS — R42 Dizziness and giddiness: Secondary | ICD-10-CM

## 2023-06-25 DIAGNOSIS — M25511 Pain in right shoulder: Secondary | ICD-10-CM

## 2023-06-25 DIAGNOSIS — E1169 Type 2 diabetes mellitus with other specified complication: Secondary | ICD-10-CM | POA: Diagnosis not present

## 2023-06-25 DIAGNOSIS — E785 Hyperlipidemia, unspecified: Secondary | ICD-10-CM | POA: Diagnosis not present

## 2023-06-25 DIAGNOSIS — G8929 Other chronic pain: Secondary | ICD-10-CM

## 2023-06-25 MED ORDER — MELOXICAM 15 MG PO TABS: 15.0000 mg | ORAL_TABLET | Freq: Every day | ORAL | 1 refills | Status: DC | PRN

## 2023-06-25 NOTE — Progress Notes (Signed)
 Established patient visit   Patient: Darrell Nunez   DOB: 1976-03-27   48 y.o. Male  MRN: [id number] Visit Date: 06/25/2023  Today's healthcare provider: Manuelita Flatness, PA-C   Chief Complaint  Patient presents with   Medical Management of Chronic Issues    Patient here to get a freestyle put on. The increase of medication(lipitor)- night of 2nd day that he did, stomach pain began, mild headaches. Has no issue with 10mg  but the 20 caused.  Is there something else he can try?  Shoulder pain- takes about 2-3 advil at night, pain is only at night. Wants to know what else he can do... advil only helps for a few hours     Subjective     Pt presents today for another freestyle libre application as the last one fell off.   He reports he did increase the atorvastatin  to 20 mg, but had abdominal pain and d/c. He is back on 10 mg daily.   He reports his right shoulder pain is now aggravating him more. Takes ibuprofen sporadically.   Denies any recurrence of odd dizziness/aphasia/shaky episode. Medications: Outpatient Medications Prior to Visit  Medication Sig   ALPRAZolam  (XANAX ) 1 MG tablet Take 1 mg by mouth 4 (four) times daily as needed. Anxiety   amLODipine  (NORVASC ) 5 MG tablet Take 5 mg by mouth daily.   aspirin  81 MG chewable tablet Chew 81 mg by mouth daily.   atorvastatin  (LIPITOR) 10 MG tablet Take 10 mg by mouth daily.   gabapentin  (NEURONTIN ) 300 MG capsule Take 1 tablet at bedtime x 1 week, then increase to 1 tablet twice daily.   glimepiride  (AMARYL ) 2 MG tablet Take 2 mg by mouth every morning.   hydrochlorothiazide  (HYDRODIURIL ) 25 MG tablet Take 1 tablet (25 mg total) by mouth every morning.   metoprolol  tartrate (LOPRESSOR ) 100 MG tablet Take 100 mg by mouth 2 (two) times daily.   naproxen (NAPROSYN) 500 MG tablet Take 1 tablet by mouth 2 (two) times daily. Was advised to not take Naproxen with Meloxicam .   Oxcarbazepine  (TRILEPTAL ) 300 MG tablet Take 150 mg  by mouth 2 (two) times daily.    OZEMPIC , 0.25 OR 0.5 MG/DOSE, 2 MG/3ML SOPN TITRATE 0.25 MG ONCE WEEKLY FOR 4 WEEKS, THEN 0.5 MG ONCE WEEKLY FOR 2 WEEKS SUBCUTANEOUSLY   pioglitazone  (ACTOS ) 30 MG tablet Take 30 mg by mouth daily.   Semaglutide ,0.25 or 0.5MG /DOS, (OZEMPIC , 0.25 OR 0.5 MG/DOSE,) 2 MG/3ML SOPN Injection 0.25 mg into skin every week   valsartan  (DIOVAN ) 320 MG tablet Take 320 mg by mouth daily.   ziprasidone  (GEODON ) 60 MG capsule Take 60 mg by mouth in the morning and at bedtime.   [DISCONTINUED] atorvastatin  (LIPITOR) 20 MG tablet Take 20 mg by mouth daily.   No facility-administered medications prior to visit.    Review of Systems  Constitutional:  Negative for fatigue and fever.  Respiratory:  Negative for cough and shortness of breath.   Cardiovascular:  Negative for chest pain, palpitations and leg swelling.  Neurological:  Negative for dizziness and headaches.       Objective    BP 119/77   Pulse 73   Temp 98 F (36.7 C) (Oral)   Ht 5' 8 (1.727 m)   Wt 296 lb 8 oz (134.5 kg)   SpO2 98%   BMI 45.08 kg/m    Physical Exam Vitals reviewed.  Constitutional:      Appearance: He is not ill-appearing.  HENT:     Head: Normocephalic.  Eyes:     Conjunctiva/sclera: Conjunctivae normal.  Cardiovascular:     Rate and Rhythm: Normal rate.  Pulmonary:     Effort: Pulmonary effort is normal. No respiratory distress.  Neurological:     General: No focal deficit present.     Mental Status: He is alert and oriented to person, place, and time.  Psychiatric:        Mood and Affect: Mood normal.        Behavior: Behavior normal.      No results found for any visits on 06/25/23.  Assessment & Plan    Chronic right shoulder pain Prefer meloxicam  over ibuprofen, advised pt not to take together. Referring to ortho/sports med -     Ambulatory referral to Sports Medicine -     Meloxicam ; Take 1 tablet (15 mg total) by mouth daily as needed for pain.  Dispense:  30 tablet; Refill: 1  Dizziness Difficult to identify etiology from patient's description of episode. Goal of freestyle is to catch any hypoglycemic episodes.  When patient described the event again today, he mentioned a brief aphasia that he did not previously. He has HLD, controlled DM, controlled HTN, and is obese--risk for TIA/CVA. The 10-year ASCVD risk score (Arnett DK, et al., 2019) is: 5.7% Will get carotid US , adding asa 81 mg today.   -     US  Carotid Bilateral  Hyperlipidemia associated with type 2 diabetes mellitus (HCC) Assessment & Plan: Ok to cont atorvastatin  10 mg for now. Recommending he add asa 81 mg .   Type 2 diabetes mellitus with other specified complication, without long-term current use of insulin (HCC) Assessment & Plan: Replacing freestyle today. Covered w/ sticker  Return in about 2 weeks (around 07/09/2023) for DMII.       Manuelita Flatness, PA-C  [facility] at [facility] [phone] (phone) [phone] (fax)  [facility]

## 2023-06-26 ENCOUNTER — Encounter: Payer: Self-pay | Admitting: Physician Assistant

## 2023-06-26 NOTE — Assessment & Plan Note (Signed)
 Ok to cont atorvastatin  10 mg for now. Recommending he add asa 81 mg .

## 2023-06-26 NOTE — Assessment & Plan Note (Signed)
 Replacing freestyle today. Covered w/ sticker

## 2023-06-26 NOTE — Assessment & Plan Note (Signed)
" >>  ASSESSMENT AND PLAN FOR TYPE 2 DIABETES MELLITUS WITH OTHER SPECIFIED COMPLICATION (HCC) WRITTEN ON 06/26/2023 12:11 PM BY Silvino Selman, PA-C  Replacing freestyle today. Covered w/ sticker "

## 2023-07-01 ENCOUNTER — Ambulatory Visit (HOSPITAL_BASED_OUTPATIENT_CLINIC_OR_DEPARTMENT_OTHER): Payer: Medicare HMO

## 2023-07-03 ENCOUNTER — Ambulatory Visit: Payer: Medicare HMO | Admitting: Family Medicine

## 2023-07-03 NOTE — Progress Notes (Deleted)
 CHIEF COMPLAINT: No chief complaint on file.  _____________________________________________________________ SUBJECTIVE  HPI  Pt is a 48 y.o. male here for evaluation of Chronic right shoulder pain  Seen in PCP office 04/10/2023, 06/25/2023, note reviewed: Chronic shoulder pain was mentioned, unclear laterality.  Pain is only at night, taking 400-600 Advil at night.  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  2022 films with mild to moderate AC joint degenerative changes, no evidence for significant rotator cuff at that time, pain was not significant enough for the patient to request an injection, continued with HEP.  06/2022 was assessed again, was started naproxen twice a day for 7-10 days ===================  Ongoing for *** Inciting event: Primarily located   Radiating Numbness/tingling Catching/locking *** Exacerbated by Therapies tried so far:  Works as Psychiatric nurse***   ------------------------------------------------------------------------------------------------------ Past Medical History:  Diagnosis Date   Anxiety    Bipolar disorder (HCC)    Chest pain, unspecified 03/05/2015   Depression    Hearing impairment    Hypertension    Panic attacks    Pure hypercholesterolemia 09/09/2021    Past Surgical History:  Procedure Laterality Date   INNER EAR SURGERY         Outpatient Encounter Medications as of 07/03/2023  Medication Sig   ALPRAZolam (XANAX) 1 MG tablet Take 1 mg by mouth 4 (four) times daily as needed. Anxiety   amLODipine (NORVASC) 5 MG tablet Take 5 mg by mouth daily.   aspirin 81 MG chewable tablet Chew 81 mg by mouth daily.   atorvastatin (LIPITOR) 10 MG tablet Take 10 mg by mouth daily.   gabapentin (NEURONTIN) 300 MG capsule Take 1 tablet at bedtime x 1 week, then increase to 1 tablet twice daily.   glimepiride (AMARYL) 2 MG tablet Take 2 mg by mouth every morning.   hydrochlorothiazide (HYDRODIURIL) 25 MG tablet Take 1 tablet (25 mg total) by mouth every morning.   meloxicam (MOBIC) 15 MG tablet Take 1 tablet (15 mg total) by mouth daily as needed for pain.   metoprolol tartrate (LOPRESSOR) 100 MG tablet Take 100 mg by mouth 2 (two) times daily.   naproxen (NAPROSYN) 500 MG tablet Take 1 tablet by mouth 2 (two) times daily. Was advised to not take Naproxen with Meloxicam.   Oxcarbazepine (TRILEPTAL) 300 MG tablet Take 150 mg by mouth 2 (two) times daily.    OZEMPIC, 0.25 OR 0.5 MG/DOSE, 2 MG/3ML SOPN TITRATE 0.25 MG ONCE WEEKLY FOR 4 WEEKS, THEN 0.5 MG ONCE WEEKLY FOR 2 WEEKS SUBCUTANEOUSLY   pioglitazone (ACTOS) 30 MG tablet Take 30 mg by mouth daily.   Semaglutide,0.25 or 0.5MG /DOS, (OZEMPIC, 0.25 OR 0.5 MG/DOSE,) 2 MG/3ML SOPN Injection 0.25 mg into skin every week   valsartan (DIOVAN) 320 MG tablet Take 320 mg by mouth daily.   ziprasidone (GEODON) 60 MG capsule Take 60 mg by mouth in the morning and at bedtime.   No facility-administered encounter medications on file as of 07/03/2023.    ------------------------------------------------------------------------------------------------------  _____________________________________________________________ OBJECTIVE  PHYSICAL EXAM  There were no vitals filed for this visit. There is no height or weight on file to calculate BMI.   reviewed  General: A+Ox3, no acute distress,  well-nourished, appropriate affect CV: pulses 2+ regular, nondiaphoretic, no peripheral edema, cap refill <2sec Lungs: no audible wheezing, non-labored breathing, bilateral chest rise/fall, nontachypneic Skin: warm, well-perfused, non-icteric, no susp lesions or rashes Neuro: *** Sensation intact, muscle tone wnl, no atrophy Psych: no signs of depression or anxiety MSK:  R Shoulder:  No deformity, swelling or muscle wasting No scapular winging FF 180, abd 180, int 0, ext 90 NTTP over the [place], clavicle, ac, coracoid, biceps groove, humerus, deltoid, subacromial space, scap spine, musculature, trap, cervical spine Neg neer, hawkins, empty can, scarf test, hornblower, resisted anterior flexion, subscap liftoff, speeds, obriens Neg ant drawer, sulcus sign Neg apprehension Negative Spurling's test bilat FROM of neck    _____________________________________________________________ ASSESSMENT/PLAN There are no diagnoses linked to this encounter.   XR, PT +/- SAB   Electronically signed by: Burna Forts, MD 07/03/2023 7:21 AM

## 2023-07-09 ENCOUNTER — Encounter: Payer: Self-pay | Admitting: Physician Assistant

## 2023-07-09 ENCOUNTER — Ambulatory Visit (INDEPENDENT_AMBULATORY_CARE_PROVIDER_SITE_OTHER): Payer: Medicare HMO | Admitting: Physician Assistant

## 2023-07-09 VITALS — BP 126/93 | HR 77 | Temp 98.0°F | Ht 68.0 in | Wt 299.0 lb

## 2023-07-09 DIAGNOSIS — R42 Dizziness and giddiness: Secondary | ICD-10-CM | POA: Diagnosis not present

## 2023-07-09 DIAGNOSIS — E876 Hypokalemia: Secondary | ICD-10-CM

## 2023-07-09 DIAGNOSIS — Z7985 Long-term (current) use of injectable non-insulin antidiabetic drugs: Secondary | ICD-10-CM

## 2023-07-09 DIAGNOSIS — E1169 Type 2 diabetes mellitus with other specified complication: Secondary | ICD-10-CM

## 2023-07-09 MED ORDER — FREESTYLE LIBRE 3 SENSOR MISC: 3 refills | Status: DC

## 2023-07-09 MED ORDER — OZEMPIC (0.25 OR 0.5 MG/DOSE) 2 MG/3ML ~~LOC~~ SOPN: 0.5000 mg | PEN_INJECTOR | SUBCUTANEOUS | 3 refills | Status: DC

## 2023-07-09 MED ORDER — NOVOFINE PLUS PEN NEEDLE 32G X 4 MM MISC: 0 refills | Status: DC

## 2023-07-09 NOTE — Assessment & Plan Note (Signed)
" >>  ASSESSMENT AND PLAN FOR TYPE 2 DIABETES MELLITUS WITH OTHER SPECIFIED COMPLICATION (HCC) WRITTEN ON 07/09/2023  4:28 PM BY Jacole Capley, PA-C  Cont ozempic  0.25 mg  Refilled  If still too expensive, will contact pharmamcy.  Advised cgm is likely not covered, but pt would like us  to try.  On review of data-- no lows, highs to 200s all come down appropriately and are explainable-- typically juice. "

## 2023-07-09 NOTE — Assessment & Plan Note (Signed)
Cont ozempic 0.25 mg  Refilled  If still too expensive, will contact pharmamcy.  Advised cgm is likely not covered, but pt would like [place] to try.  On review of data-- no lows, highs to 200s all come down appropriately and are explainable-- typically juice.

## 2023-07-09 NOTE — Assessment & Plan Note (Signed)
Chronic May be 2.2 hydrochlorothiazide use.  Discussed potential to stop if K still low on repeat labs. Pt reports he has starting to take an otc potassium supplement. Recommended only one daily.  If K still low w/ K supplement, would d/c hydrochlorothiazide and increase amlodipine dose

## 2023-07-09 NOTE — Progress Notes (Signed)
Established patient visit   Patient: Darrell Nunez   DOB: 08-22-1975   48 y.o. Male  MRN: [id number] Visit Date: 07/09/2023  Today's healthcare provider: Alfredia Ferguson, PA-C   Chief Complaint  Patient presents with   Medical Management of Chronic Issues    States things are decent-  Would like sensor changed Would like to check on ozempic pen, if we can get another one.     Subjective    Pt presents to review freestyle libre 3 data.  Medications: Outpatient Medications Prior to Visit  Medication Sig   Potassium 99 MG TABS Take by mouth. Takes 2 tablets daily   ALPRAZolam (XANAX) 1 MG tablet Take 1 mg by mouth 4 (four) times daily as needed. Anxiety   amLODipine (NORVASC) 5 MG tablet Take 5 mg by mouth daily.   aspirin 81 MG chewable tablet Chew 81 mg by mouth daily.   atorvastatin (LIPITOR) 10 MG tablet Take 10 mg by mouth daily.   gabapentin (NEURONTIN) 300 MG capsule Take 1 tablet at bedtime x 1 week, then increase to 1 tablet twice daily.   glimepiride (AMARYL) 2 MG tablet Take 2 mg by mouth every morning.   hydrochlorothiazide (HYDRODIURIL) 25 MG tablet Take 1 tablet (25 mg total) by mouth every morning.   meloxicam (MOBIC) 15 MG tablet Take 1 tablet (15 mg total) by mouth daily as needed for pain.   metoprolol tartrate (LOPRESSOR) 100 MG tablet Take 100 mg by mouth 2 (two) times daily.   naproxen (NAPROSYN) 500 MG tablet Take 1 tablet by mouth 2 (two) times daily. Was advised to not take Naproxen with Meloxicam.   Oxcarbazepine (TRILEPTAL) 300 MG tablet Take 150 mg by mouth 2 (two) times daily.    pioglitazone (ACTOS) 30 MG tablet Take 30 mg by mouth daily.   valsartan (DIOVAN) 320 MG tablet Take 320 mg by mouth daily.   ziprasidone (GEODON) 60 MG capsule Take 60 mg by mouth in the morning and at bedtime.   [DISCONTINUED] OZEMPIC, 0.25 OR 0.5 MG/DOSE, 2 MG/3ML SOPN TITRATE 0.25 MG ONCE WEEKLY FOR 4 WEEKS, THEN 0.5 MG ONCE WEEKLY FOR 2 WEEKS SUBCUTANEOUSLY    [DISCONTINUED] Semaglutide,0.25 or 0.5MG /DOS, (OZEMPIC, 0.25 OR 0.5 MG/DOSE,) 2 MG/3ML SOPN Injection 0.25 mg into skin every week   No facility-administered medications prior to visit.    Review of Systems  Constitutional:  Negative for fatigue and fever.  Respiratory:  Negative for cough and shortness of breath.   Cardiovascular:  Negative for chest pain, palpitations and leg swelling.  Neurological:  Negative for dizziness and headaches.       Objective    BP (!) 126/93   Pulse 77   Temp 98 F (36.7 C) (Oral)   Ht 5\' 8"  (1.727 m)   Wt 299 lb (135.6 kg)   SpO2 99%   BMI 45.46 kg/m    Physical Exam Vitals reviewed.  Constitutional:      Appearance: He is not ill-appearing.  HENT:     Head: Normocephalic.  Eyes:     Conjunctiva/sclera: Conjunctivae normal.  Cardiovascular:     Rate and Rhythm: Normal rate.  Pulmonary:     Effort: Pulmonary effort is normal. No respiratory distress.  Neurological:     Mental Status: He is alert and oriented to person, place, and time.  Psychiatric:        Mood and Affect: Mood normal.        Behavior: Behavior normal.  No results found for any visits on 07/09/23.  Assessment & Plan    Type 2 diabetes mellitus with other specified complication, without long-term current use of insulin (HCC) Assessment & Plan: Cont ozempic 0.25 mg  Refilled  If still too expensive, will contact pharmamcy.  Advised cgm is likely not covered, but pt would like [place] to try.  On review of data-- no lows, highs to 200s all come down appropriately and are explainable-- typically juice.  Orders: -     Ozempic (0.25 or 0.5 MG/DOSE); Inject 0.5 mg as directed once a week.  Dispense: 9 mL; Refill: 3 -     NovoFine Plus Pen Needle; Use w/ ozempic pen  Dispense: 30 each; Refill: 0 -     FreeStyle Libre 3 Sensor; Use to continuously monitor blood sugar, change every 15 days  Dispense: 2 each; Refill: 3  Dizziness -     US Carotid  Bilateral  Hypokalemia Assessment & Plan: Chronic May be 2.2 hydrochlorothiazide use.  Discussed potential to stop if K still low on repeat labs. Pt reports he has starting to take an otc potassium supplement. Recommended only one daily.  If K still low w/ K supplement, would d/c hydrochlorothiazide and increase amlodipine dose    Return if symptoms worsen or fail to improve.       Alfredia Ferguson, PA-C  [facility] [phone] (phone) [phone] (fax)  [facility]

## 2023-07-10 ENCOUNTER — Encounter: Payer: Self-pay | Admitting: Physician Assistant

## 2023-07-10 DIAGNOSIS — E1169 Type 2 diabetes mellitus with other specified complication: Secondary | ICD-10-CM

## 2023-07-10 MED ORDER — NOVOFINE PLUS PEN NEEDLE 32G X 4 MM MISC
3 refills | Status: DC
Start: 2023-07-10 — End: 2023-07-15

## 2023-07-15 ENCOUNTER — Other Ambulatory Visit: Payer: Self-pay | Admitting: Physician Assistant

## 2023-07-15 DIAGNOSIS — E1169 Type 2 diabetes mellitus with other specified complication: Secondary | ICD-10-CM

## 2023-07-15 MED ORDER — OZEMPIC (0.25 OR 0.5 MG/DOSE) 2 MG/3ML ~~LOC~~ SOPN: 0.5000 mg | PEN_INJECTOR | SUBCUTANEOUS | 1 refills | Status: DC

## 2023-07-15 MED ORDER — NOVOFINE PLUS PEN NEEDLE 32G X 4 MM MISC: 3 refills | Status: DC

## 2023-07-21 ENCOUNTER — Encounter: Payer: Self-pay | Admitting: Physician Assistant

## 2023-08-11 ENCOUNTER — Encounter: Payer: Self-pay | Admitting: Physician Assistant

## 2023-09-07 ENCOUNTER — Other Ambulatory Visit: Payer: Self-pay | Admitting: Physician Assistant

## 2023-09-07 DIAGNOSIS — I152 Hypertension secondary to endocrine disorders: Secondary | ICD-10-CM

## 2023-09-08 ENCOUNTER — Other Ambulatory Visit: Payer: Self-pay | Admitting: Physician Assistant

## 2023-09-08 DIAGNOSIS — E1169 Type 2 diabetes mellitus with other specified complication: Secondary | ICD-10-CM

## 2023-09-29 ENCOUNTER — Ambulatory Visit

## 2023-10-16 ENCOUNTER — Telehealth: Payer: Self-pay | Admitting: Physician Assistant

## 2023-10-16 MED ORDER — PIOGLITAZONE HCL 30 MG PO TABS: 30.0000 mg | ORAL_TABLET | Freq: Every day | ORAL | 1 refills | Status: DC

## 2023-10-16 NOTE — Telephone Encounter (Signed)
 Copied from CRM [id number]. Topic: Clinical - Medication Refill >> Oct 16, 2023  1:35 PM [place] A wrote: Medication: pioglitazone (ACTOS) 30 MG tablet Patient would like 90 day supply  Has the patient contacted their pharmacy? Yes (Agent: If no, request that the patient contact the pharmacy for the refill. If patient does not wish to contact the pharmacy document the reason why and proceed with request.) (Agent: If yes, when and what did the pharmacy advise?)Contact Primary  This is the patient's preferred pharmacy:  [facility] [address] [place], [place] - [address] [place] [address] Phone: [phone] Fax: [phone]  Is this the correct pharmacy for this prescription? Yes If no, delete pharmacy and type the correct one.   Has the prescription been filled recently? No  Is the patient out of the medication? No  Has the patient been seen for an appointment in the last year OR does the patient have an upcoming appointment? Yes  Can we respond through MyChart? Yes  Agent: Please be advised that Rx refills may take up to 3 business days. We ask that you follow-up with your pharmacy.

## 2023-10-23 ENCOUNTER — Other Ambulatory Visit: Payer: Self-pay | Admitting: Physician Assistant

## 2023-10-23 MED ORDER — GLIMEPIRIDE 2 MG PO TABS: 2.0000 mg | ORAL_TABLET | Freq: Every morning | ORAL | 1 refills | Status: DC

## 2023-10-23 MED ORDER — METOPROLOL TARTRATE 100 MG PO TABS: 100.0000 mg | ORAL_TABLET | Freq: Two times a day (BID) | ORAL | 1 refills | Status: DC

## 2023-10-23 NOTE — Telephone Encounter (Unsigned)
 Copied from CRM [id number]. Topic: Clinical - Medication Refill >> Oct 23, 2023 11:19 AM Darrell Nunez wrote: Medication: glimepiride (AMARYL) 2 MG tablet metoprolol  tartrate (LOPRESSOR ) 100 MG tablet  Has the patient contacted their pharmacy? Yes (Agent: If no, request that the patient contact the pharmacy for the refill. If patient does not wish to contact the pharmacy document the reason why and proceed with request.) (Agent: If yes, when and what did the pharmacy advise?)  This is the patient's preferred pharmacy:  [facility] [address] [place], [place] - [address] [place] [address] Phone: [phone] Fax: [phone]  Is this the correct pharmacy for this prescription? Yes If no, delete pharmacy and type the correct one.   Has the prescription been filled recently? No  Is the patient out of the medication? Yes  Has the patient been seen for an appointment in the last year OR does the patient have an upcoming appointment? Yes  Can we respond through MyChart? Yes  Agent: Please be advised that Rx refills may take up to 3 business days. We ask that you follow-up with your pharmacy.

## 2023-10-30 ENCOUNTER — Encounter: Payer: Self-pay | Admitting: Pharmacist

## 2023-10-30 ENCOUNTER — Other Ambulatory Visit: Payer: Self-pay | Admitting: Pharmacist

## 2023-10-30 MED ORDER — VALSARTAN 320 MG PO TABS: 320.0000 mg | ORAL_TABLET | Freq: Every day | ORAL | 0 refills | Status: DC

## 2023-10-30 NOTE — Progress Notes (Addendum)
 Pharmacy Quality Measure Review  This patient is appearing on a report for being at risk of failing the adherence measure for diabetes and hypertension (ACEi/ARB) medications this calendar year.   Medication: valsartan 320mg  Last fill date: 08/03/2023 for 30 day supply (prescriber listed it Barnet Lias with [facility] with notation that Office visit needed)  He was supposed to have BMET rechecked in early 2025 due to low potassium.  Medication: pioglitazone   Last fill date: 06/02/2023 for 90 day supply Reviewed Dr Anson Basta records and patient filled for 90 day supply on 10/16/2023 - picked up at pharmacy 10/20/2023  He is also taking glimepiride  2mg  daily for diabetes - Last filled 09/22/2023 for 90 days.    Left voicemail for patient to return my call at their convenience and also sent MyChart message to patient. Will collaborate with provider to facilitate refill needs for valsartan and also see when follow up is recommended.   Cecilie Coffee, PharmD Clinical Pharmacist [facility]  Population Health [phone]   10/30/2023 - Trenton Frock, [facility] did sent in Rx for valsartan 320mg  #90. She noted that patient needs to come in for labs.  Patient called me back and I gave him instructions on getting labs checked. Offered to make appointment for labs while on the phone bu the state he needs to check with his wife so that she can come with him. He will call back to make appt.   Cecilie Coffee, PharmD Clinical Pharmacist [facility]

## 2023-11-26 ENCOUNTER — Telehealth: Payer: Self-pay | Admitting: Physician Assistant

## 2023-11-26 NOTE — Telephone Encounter (Signed)
 Copied from CRM [id number]. Topic: Medicare AWV >> Nov 26, 2023  3:13 PM Nathanel DEL wrote: Reason for CRM: LVM 11/26/2023 to schedule AWV. Please schedule Virtual or Telehealth visits ONLY  Nathanel Paschal; Care Guide Ambulatory Clinical Support [place] [facility] Direct Dial: [phone]

## 2023-12-10 ENCOUNTER — Encounter: Payer: Self-pay | Admitting: Physician Assistant

## 2023-12-10 ENCOUNTER — Other Ambulatory Visit: Payer: Self-pay | Admitting: Physician Assistant

## 2023-12-10 DIAGNOSIS — E1169 Type 2 diabetes mellitus with other specified complication: Secondary | ICD-10-CM

## 2023-12-11 ENCOUNTER — Ambulatory Visit (INDEPENDENT_AMBULATORY_CARE_PROVIDER_SITE_OTHER): Admitting: Physician Assistant

## 2023-12-11 ENCOUNTER — Encounter: Payer: Self-pay | Admitting: Physician Assistant

## 2023-12-11 VITALS — BP 111/76 | HR 66 | Ht 68.0 in | Wt 302.8 lb

## 2023-12-11 DIAGNOSIS — E1169 Type 2 diabetes mellitus with other specified complication: Secondary | ICD-10-CM

## 2023-12-11 DIAGNOSIS — Z7984 Long term (current) use of oral hypoglycemic drugs: Secondary | ICD-10-CM

## 2023-12-11 DIAGNOSIS — I10 Essential (primary) hypertension: Secondary | ICD-10-CM | POA: Diagnosis not present

## 2023-12-11 DIAGNOSIS — R569 Unspecified convulsions: Secondary | ICD-10-CM

## 2023-12-11 DIAGNOSIS — E876 Hypokalemia: Secondary | ICD-10-CM | POA: Diagnosis not present

## 2023-12-11 DIAGNOSIS — R55 Syncope and collapse: Secondary | ICD-10-CM

## 2023-12-11 MED ORDER — AMLODIPINE BESYLATE 5 MG PO TABS: 5.0000 mg | ORAL_TABLET | Freq: Every day | ORAL | 1 refills | Status: DC

## 2023-12-11 NOTE — Progress Notes (Signed)
 Established patient visit   Patient: Darrell Nunez   DOB: 07-03-1975   48 y.o. Male  MRN: [id number] Visit Date: 12/11/2023  Today's healthcare provider: Manuelita Flatness, PA-C   Cc. Syncopal episode  Subjective    Pt is accompanied by his wife today.  Discussed the use of AI scribe software for clinical note transcription with the patient, who gave verbal consent to proceed.  History of Present Illness   Darrell Nunez is a 48 year old male who presents with recurrent episodes of syncope.  He experienced a recent syncope episode while alone in the kitchen. After bending over and turning around, he felt his body stop breathing and lost consciousness for approximately 30 seconds to a minute. During the episode, he had jerking movements from the waist down. Upon regaining consciousness, he had shoulder and chest pain, which resolved except for a tingling sensation in the underarm area that lasted until the next morning. He took Tylenol  for the pain.  After the episode, he drove to an appointment, experiencing slow response times. He later checked his oxygen saturation and heart rate, which were 98% and 65 bpm, respectively.   He has had similar episodes in the past, including one that appeared more seizure-like. Previous evaluations have not identified a cause. An MRI has not been performed due to cochlear implants.  He does not regularly monitor his blood sugar. He has been off Ozempic  for over three months due to cost and availability. He takes potassium supplements on a variable schedule.  He currently has no headaches, nausea, dizziness, or palpitations. Feels back to his baseline.      Medications: Outpatient Medications Prior to Visit  Medication Sig   glimepiride  (AMARYL ) 2 MG tablet Take 1 tablet (2 mg total) by mouth every morning. (Patient taking differently: Take 4 mg by mouth every morning.)   ALPRAZolam  (XANAX ) 1 MG tablet Take 1 mg by mouth 4 (four) times daily as  needed. Anxiety   aspirin  81 MG chewable tablet Chew 81 mg by mouth daily.   atorvastatin  (LIPITOR) 10 MG tablet Take 1 tablet by mouth once daily   Continuous Glucose Sensor (FREESTYLE LIBRE 3 SENSOR) MISC Use to continuously monitor blood sugar, change every 15 days   gabapentin  (NEURONTIN ) 300 MG capsule Take 1 tablet at bedtime x 1 week, then increase to 1 tablet twice daily.   hydrochlorothiazide  (HYDRODIURIL ) 25 MG tablet TAKE 1 TABLET BY MOUTH ONCE DAILY IN THE MORNING   Insulin Pen Needle (NOVOFINE PLUS PEN NEEDLE) 32G X 4 MM MISC Use w/ ozempic  pen   meloxicam  (MOBIC ) 15 MG tablet Take 1 tablet (15 mg total) by mouth daily as needed for pain.   metoprolol  tartrate (LOPRESSOR ) 100 MG tablet Take 1 tablet (100 mg total) by mouth 2 (two) times daily.   naproxen (NAPROSYN) 500 MG tablet Take 1 tablet by mouth 2 (two) times daily. Was advised to not take Naproxen with Meloxicam .   Oxcarbazepine  (TRILEPTAL ) 300 MG tablet Take 150 mg by mouth 2 (two) times daily.    pioglitazone  (ACTOS ) 30 MG tablet Take 1 tablet (30 mg total) by mouth daily.   Potassium 99 MG TABS Take by mouth. Takes 2 tablets daily   valsartan  (DIOVAN ) 320 MG tablet Take 1 tablet (320 mg total) by mouth daily.   ziprasidone  (GEODON ) 60 MG capsule Take 60 mg by mouth in the morning and at bedtime.   [DISCONTINUED] amLODipine  (NORVASC ) 5 MG tablet Take 5 mg by  mouth daily.   [DISCONTINUED] Semaglutide ,0.25 or 0.5MG /DOS, (OZEMPIC , 0.25 OR 0.5 MG/DOSE,) 2 MG/3ML SOPN Inject 0.5 mg as directed once a week.   No facility-administered medications prior to visit.    Review of Systems  Constitutional:  Negative for fatigue and fever.  Respiratory:  Negative for cough and shortness of breath.   Cardiovascular:  Negative for chest pain, palpitations and leg swelling.  Neurological:  Positive for syncope. Negative for dizziness and headaches.       Objective    BP 111/76   Pulse 66   Ht 5' 8 (1.727 m)   Wt (!) 302 lb 12.8  oz (137.3 kg)   BMI 46.04 kg/m    Physical Exam Vitals reviewed.  Constitutional:      Appearance: He is not ill-appearing.  HENT:     Head: Normocephalic.  Eyes:     Conjunctiva/sclera: Conjunctivae normal.     Pupils: Pupils are equal, round, and reactive to light.  Cardiovascular:     Rate and Rhythm: Normal rate.  Pulmonary:     Effort: Pulmonary effort is normal. No respiratory distress.  Neurological:     General: No focal deficit present.     Mental Status: He is alert and oriented to person, place, and time.  Psychiatric:        Mood and Affect: Mood normal.        Behavior: Behavior normal.      Results for orders placed or performed in visit on 12/11/23  HgB A1c  Result Value Ref Range   Hgb A1c MFr Bld 6.5 (H) <5.7 %   Mean Plasma Glucose 140 mg/dL   eAG (mmol/L) 7.7 mmol/L  CBC with Differential/Platelet  Result Value Ref Range   WBC 8.3 3.8 - 10.8 Thousand/uL   RBC 4.87 4.20 - 5.80 Million/uL   Hemoglobin 14.3 13.2 - 17.1 g/dL   HCT 56.9 61.4 - 49.9 %   MCV 88.3 80.0 - 100.0 fL   MCH 29.4 27.0 - 33.0 pg   MCHC 33.3 32.0 - 36.0 g/dL   RDW 85.7 88.9 - 84.9 %   Platelets 248 140 - 400 Thousand/uL   MPV 10.5 7.5 - 12.5 fL   Neutro Abs 4,557 1,500 - 7,800 cells/uL   Absolute Lymphocytes 2,905 850 - 3,900 cells/uL   Absolute Monocytes 739 200 - 950 cells/uL   Eosinophils Absolute 58 15 - 500 cells/uL   Basophils Absolute 42 0 - 200 cells/uL   Neutrophils Relative % 54.9 %   Total Lymphocyte 35.0 %   Monocytes Relative 8.9 %   Eosinophils Relative 0.7 %   Basophils Relative 0.5 %  Basic metabolic panel  Result Value Ref Range   Glucose, Bld 141 (H) 65 - 99 mg/dL   BUN 16 7 - 25 mg/dL   Creat 8.55 (H) 9.39 - 1.29 mg/dL   eGFR 60 > OR = 60 fO/fpw/8.26f7   BUN/Creatinine Ratio 11 6 - 22 (calc)   Sodium 140 135 - 146 mmol/L   Potassium 3.7 3.5 - 5.3 mmol/L   Chloride 102 98 - 110 mmol/L   CO2 25 20 - 32 mmol/L   Calcium  9.4 8.6 - 10.3 mg/dL     Assessment & Plan    Syncope, unspecified syncope type Seizure-like activity (HCC) Previous similar episodes were determined not to be hypoglyemic episodes, but pt does not check BS regularly and cannot afford cgm sensor Referring to neurology -     Ambulatory referral to Neurology  Type 2 diabetes mellitus with other specified complication, without long-term current use of insulin (HCC) Managed on glimepiride  4 mg, actors 30 mg Pt cannot afford ozempic .  Repeat A1c. Overdue for labs. -     Basic metabolic panel with GFR; Future -     CBC with Differential/Platelet; Future -     Hemoglobin A1c; Future  Hypokalemia Pt takes potassium 99 otc supplement 3-4 times a week. -     Basic metabolic panel with GFR; Future -     CBC with Differential/Platelet; Future -     Hemoglobin A1c; Future  Benign essential hypertension Still well controlled w/ amlodipine  5, hydrochlorothiazide  25, metop 100 bid, valsartan  320 mg -     amLODIPine  Besylate; Take 1 tablet (5 mg total) by mouth daily.  Dispense: 90 tablet; Refill: 1  Return if symptoms worsen or fail to improve.     Manuelita Flatness, PA-C  [facility] at [facility] [phone] (phone) [phone] (fax)  [facility]

## 2023-12-12 ENCOUNTER — Encounter: Payer: Self-pay | Admitting: Physician Assistant

## 2023-12-12 LAB — CBC WITH DIFFERENTIAL/PLATELET
Absolute Lymphocytes: 2905 {cells}/uL (ref 850–3900)
Absolute Monocytes: 739 {cells}/uL (ref 200–950)
Basophils Absolute: 42 {cells}/uL (ref 0–200)
Basophils Relative: 0.5 %
Eosinophils Absolute: 58 {cells}/uL (ref 15–500)
Eosinophils Relative: 0.7 %
HCT: 43 % (ref 38.5–50.0)
Hemoglobin: 14.3 g/dL (ref 13.2–17.1)
MCH: 29.4 pg (ref 27.0–33.0)
MCHC: 33.3 g/dL (ref 32.0–36.0)
MCV: 88.3 fL (ref 80.0–100.0)
MPV: 10.5 fL (ref 7.5–12.5)
Monocytes Relative: 8.9 %
Neutro Abs: 4557 {cells}/uL (ref 1500–7800)
Neutrophils Relative %: 54.9 %
Platelets: 248 Thousand/uL (ref 140–400)
RBC: 4.87 Million/uL (ref 4.20–5.80)
RDW: 14.2 % (ref 11.0–15.0)
Total Lymphocyte: 35 %
WBC: 8.3 Thousand/uL (ref 3.8–10.8)

## 2023-12-12 LAB — HEMOGLOBIN A1C
Hgb A1c MFr Bld: 6.5 % — ABNORMAL HIGH (ref <5.7)
Mean Plasma Glucose: 140 mg/dL
eAG (mmol/L): 7.7 mmol/L

## 2023-12-12 LAB — BASIC METABOLIC PANEL WITH GFR
BUN/Creatinine Ratio: 11 (calc) (ref 6–22)
BUN: 16 mg/dL (ref 7–25)
CO2: 25 mmol/L (ref 20–32)
Calcium: 9.4 mg/dL (ref 8.6–10.3)
Chloride: 102 mmol/L (ref 98–110)
Creat: 1.44 mg/dL — ABNORMAL HIGH (ref 0.60–1.29)
Glucose, Bld: 141 mg/dL — ABNORMAL HIGH (ref 65–99)
Potassium: 3.7 mmol/L (ref 3.5–5.3)
Sodium: 140 mmol/L (ref 135–146)
eGFR: 60 mL/min/1.73m2 (ref >=60)

## 2023-12-14 ENCOUNTER — Ambulatory Visit: Payer: Self-pay | Admitting: Physician Assistant

## 2023-12-17 DIAGNOSIS — E7800 Pure hypercholesterolemia, unspecified: Secondary | ICD-10-CM | POA: Diagnosis not present

## 2023-12-17 DIAGNOSIS — I10 Essential (primary) hypertension: Secondary | ICD-10-CM | POA: Diagnosis not present

## 2023-12-17 DIAGNOSIS — F319 Bipolar disorder, unspecified: Secondary | ICD-10-CM | POA: Diagnosis not present

## 2023-12-17 DIAGNOSIS — E1165 Type 2 diabetes mellitus with hyperglycemia: Secondary | ICD-10-CM | POA: Diagnosis not present

## 2023-12-18 ENCOUNTER — Encounter: Payer: Self-pay | Admitting: Neurology

## 2023-12-22 ENCOUNTER — Other Ambulatory Visit: Payer: Self-pay | Admitting: *Deleted

## 2023-12-22 ENCOUNTER — Encounter: Payer: Self-pay | Admitting: Physician Assistant

## 2023-12-22 MED ORDER — GLIMEPIRIDE 4 MG PO TABS: 4.0000 mg | ORAL_TABLET | Freq: Every day | ORAL | 1 refills | Status: DC

## 2023-12-31 ENCOUNTER — Encounter: Payer: Self-pay | Admitting: Physician Assistant

## 2024-01-20 IMAGING — CT CT ABD-PELV W/ CM
2 of 5 series · 16 of 46 positions shown, 18 images · IV contrast (APPLIED)
Comparison: None.

CLINICAL DATA: Abdominal pain, pancreatitis

EXAM:
CT ABDOMEN AND PELVIS WITH CONTRAST
TECHNIQUE: Multidetector CT imaging of the abdomen and pelvis was performed
using the standard protocol following bolus administration of
intravenous contrast.

[Series 2: abd pel w · axial · 0.93mm/px · z∈[+492,+942]mm · 13 of 103 slices shown, 15 images]
[im 7/103  soft-tissue]
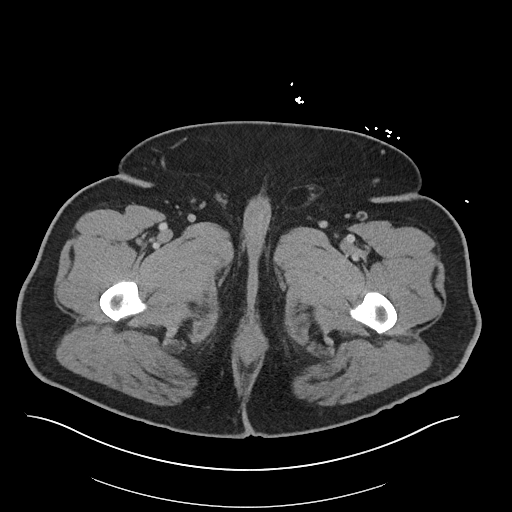
[im 7/103  bone]
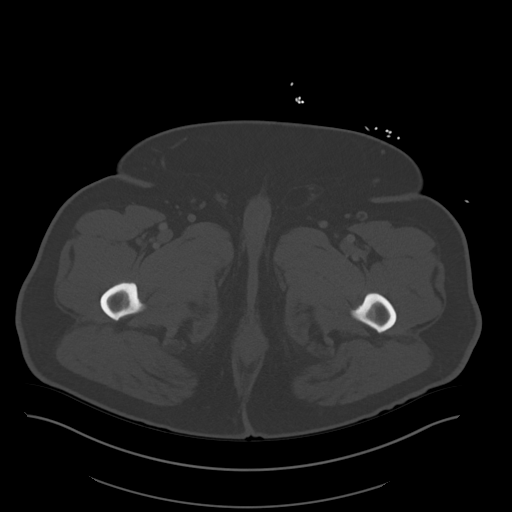
[im 13/103  soft-tissue]
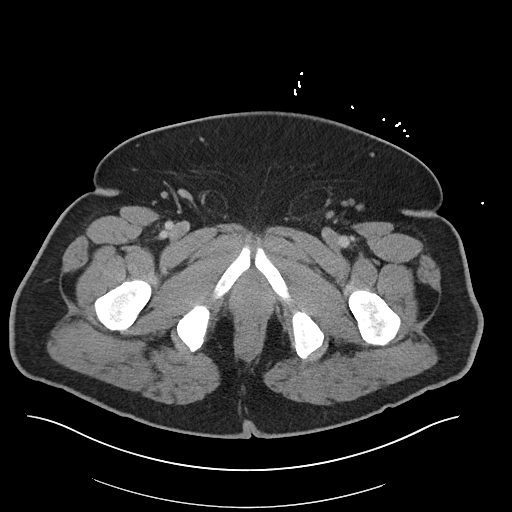
[im 25/103  soft-tissue]
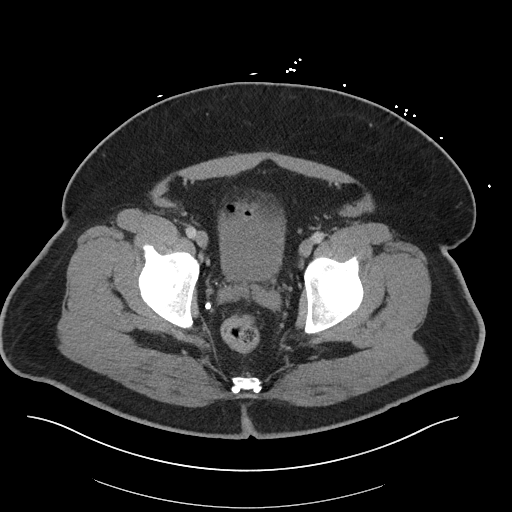
[im 31/103  soft-tissue]
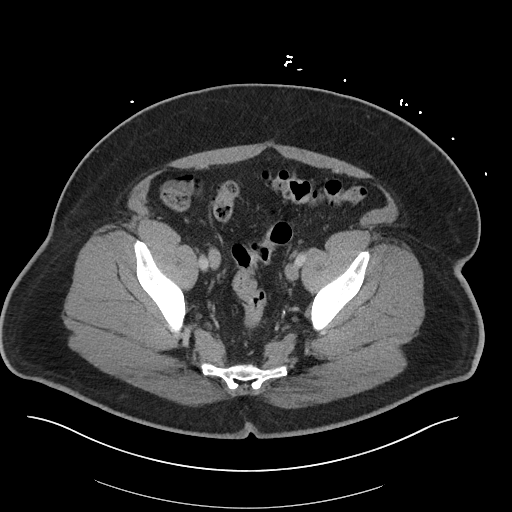
[im 37/103  soft-tissue]
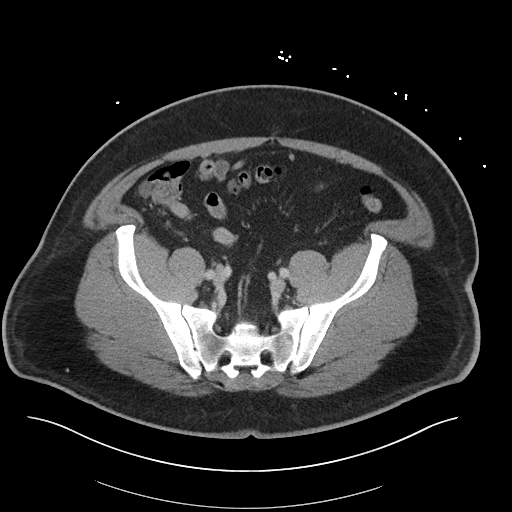
[im 43/103  soft-tissue]
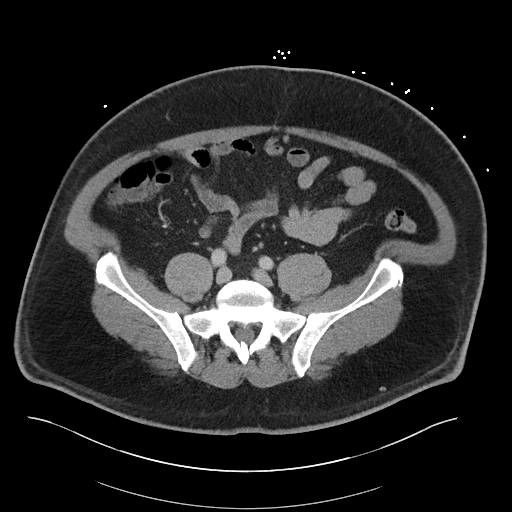
[im 55/103  soft-tissue]
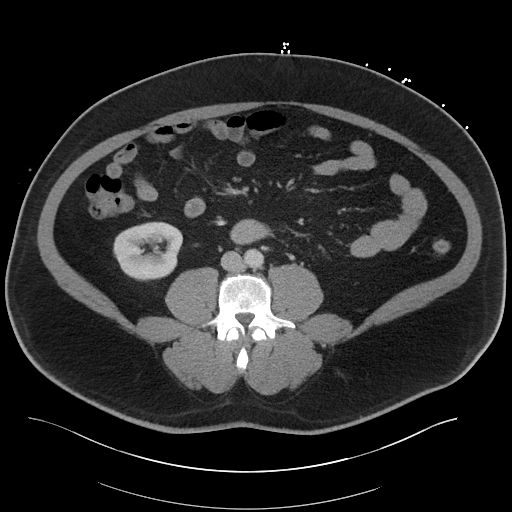
[im 61/103  soft-tissue]
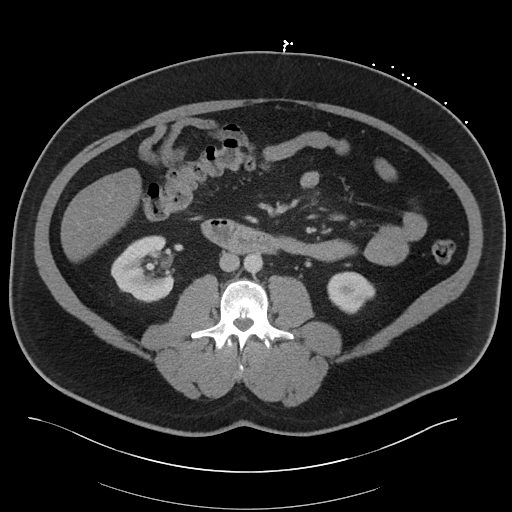
[im 67/103  soft-tissue]
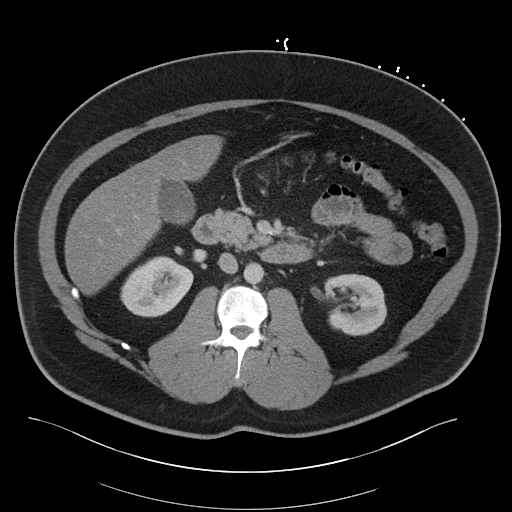
[im 67/103  bone]
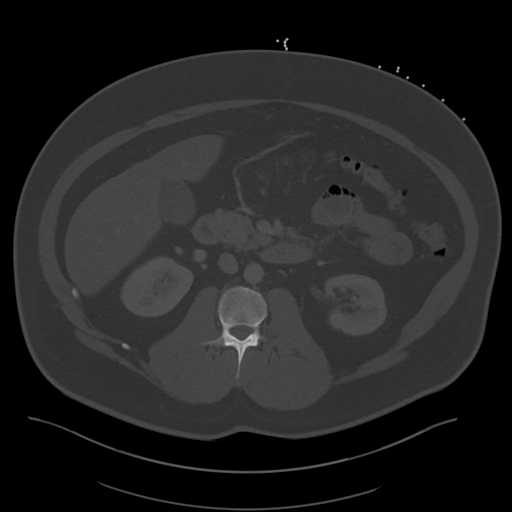
[im 73/103  soft-tissue]
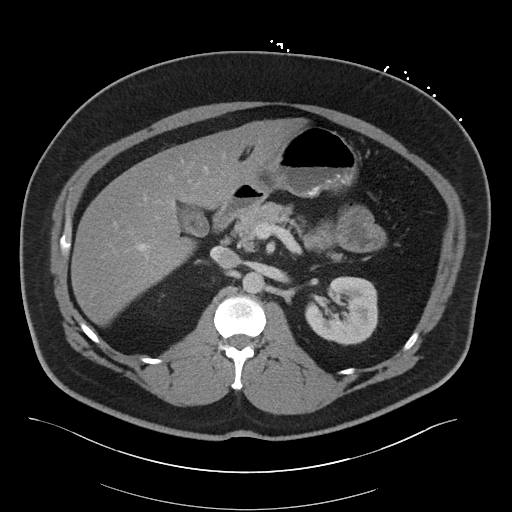
[im 79/103  soft-tissue]
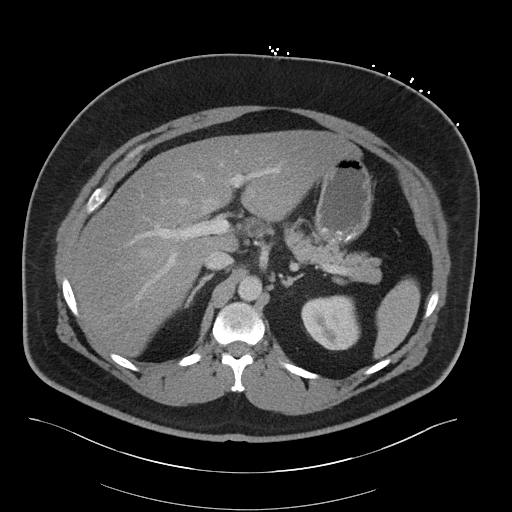
[im 91/103  soft-tissue]
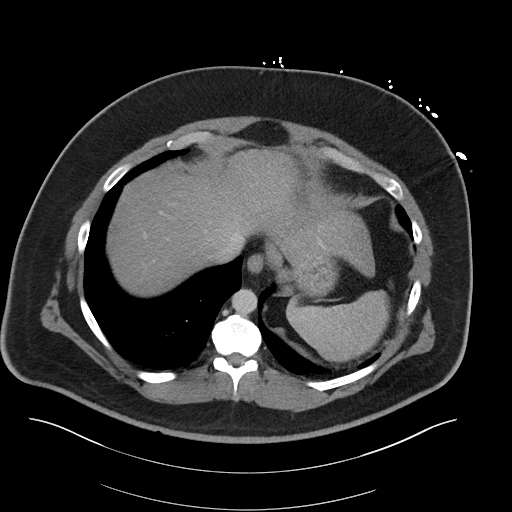
[im 97/103  soft-tissue]
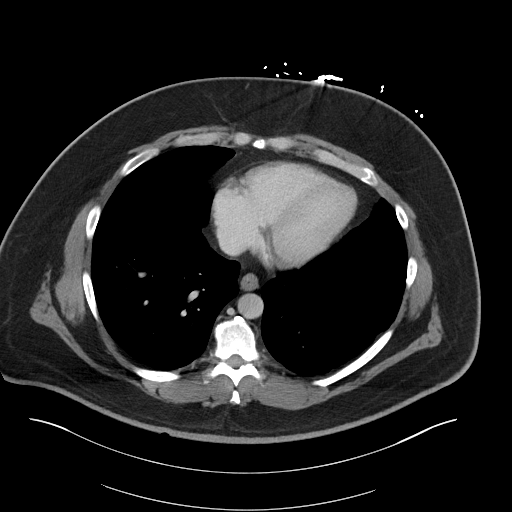

[Series 5: coronal · coronal · 0.92mm/px · 3 of 123 slices shown]
[im 41/123  soft-tissue]
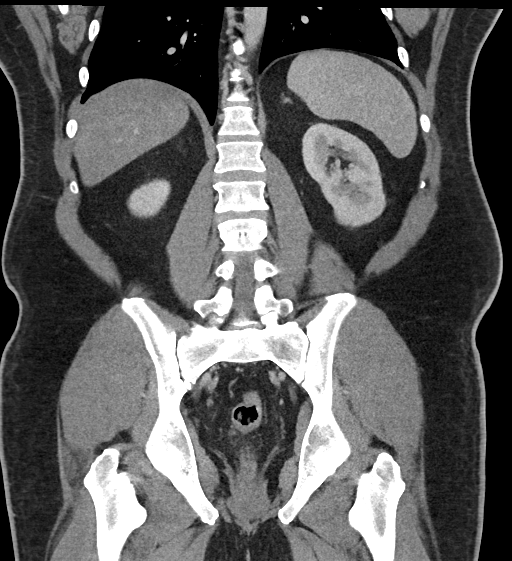
[im 55/123  soft-tissue]
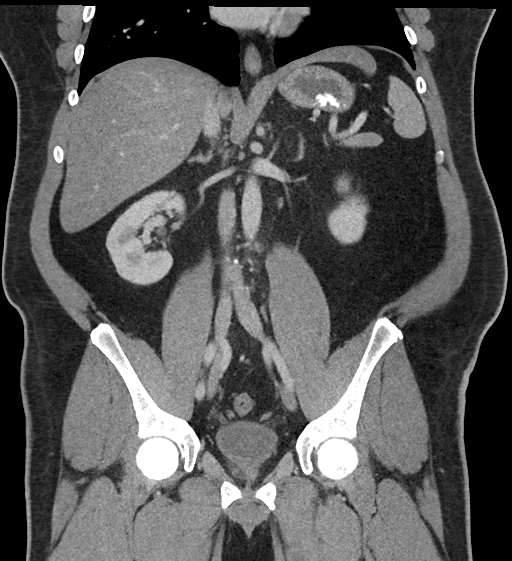
[im 68/123  soft-tissue]
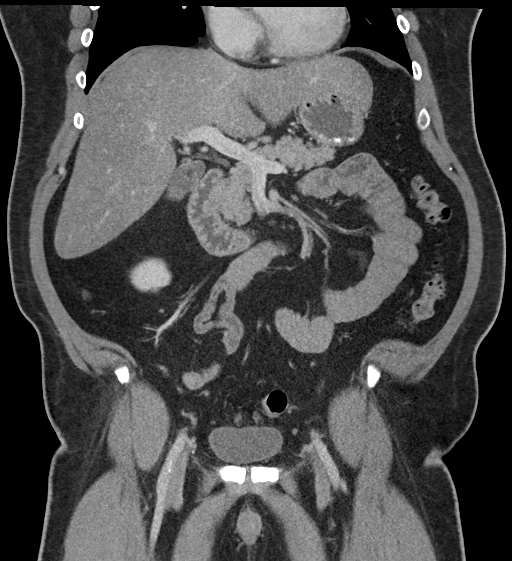

[16 of 46 positions shown; findings below may reference images not displayed]

RADIATION DOSE REDUCTION: This exam was performed according to the
departmental dose-optimization program which includes automated
exposure control, adjustment of the mA and/or kV according to
patient size and/or use of iterative reconstruction technique.

CONTRAST:  100mL OMNIPAQUE IOHEXOL 300 MG/ML  SOLN
FINDINGS: Lower chest: Unremarkable.  Left hemidiaphragm is elevated.

Hepatobiliary: There is fatty infiltration in the liver. Liver
measures 21.5 cm in length. Gallbladder stones are seen. There is no
wall thickening in gallbladder.

Pancreas: No focal abnormality is seen.

Spleen: Unremarkable.

Adrenals/Urinary Tract: Adrenals are not enlarged. There is no
hydronephrosis. There are no renal or ureteral stones. Urinary
bladder is unremarkable.

Stomach/Bowel: Stomach is unremarkable. Small bowel loops are not
dilated. Appendix is not dilated. There is no significant wall
thickening in colon. Scattered diverticula are seen in colon without
signs of focal diverticulitis.

Vascular/Lymphatic: Unremarkable.

Reproductive: Unremarkable.

Other: There is no ascites or pneumoperitoneum. Small umbilical
hernia containing fat is seen. Bilateral inguinal hernias containing
fat are noted, larger on the left side.

Musculoskeletal: There are few small scattered sclerotic densities
in the pelvis largest measuring 12 mm in the right acetabulum.
IMPRESSION: There is no evidence of intestinal obstruction or pneumoperitoneum.
There is no hydronephrosis. Appendix is not dilated.

Enlarged fatty liver.  Gallbladder stones.  Diverticulosis of colon.

There are few small sclerotic densities in the pelvis and proximal
femurs, possibly incidental benign bone islands. Less likely
possibility would be sclerotic metastatic disease. If clinically
warranted, follow-up radionuclide bone scan may be considered.

Other findings as described in the body of the report.

## 2024-02-03 ENCOUNTER — Other Ambulatory Visit: Payer: Self-pay | Admitting: *Deleted

## 2024-02-03 MED ORDER — VALSARTAN 320 MG PO TABS: 320.0000 mg | ORAL_TABLET | Freq: Every day | ORAL | 0 refills | Status: DC

## 2024-02-05 IMAGING — CT CT ABD-PELV W/ CM
2 of 5 series · 16 of 46 positions shown, 18 images · IV contrast (APPLIED)
Comparison: 08/09/2021

CLINICAL DATA: Intermittent abdominal pain, history of pancreatitis

EXAM:
CT ABDOMEN AND PELVIS WITH CONTRAST
TECHNIQUE: Multidetector CT imaging of the abdomen and pelvis was performed
using the standard protocol following bolus administration of
intravenous contrast.

[Series 2: abd pel w · axial · 0.93mm/px · z∈[-539,-114]mm · 13 of 95 slices shown, 15 images]
[im 5/95  soft-tissue]
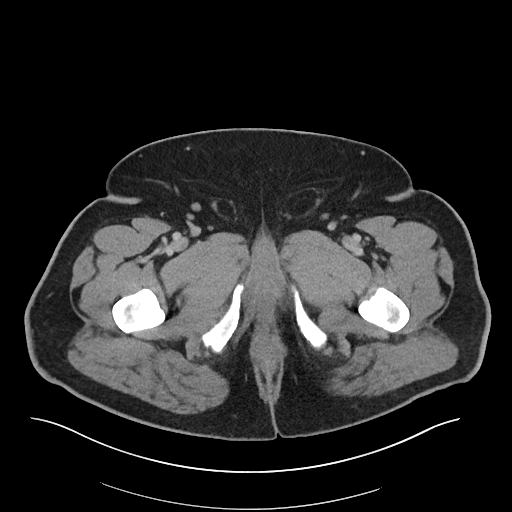
[im 5/95  bone]
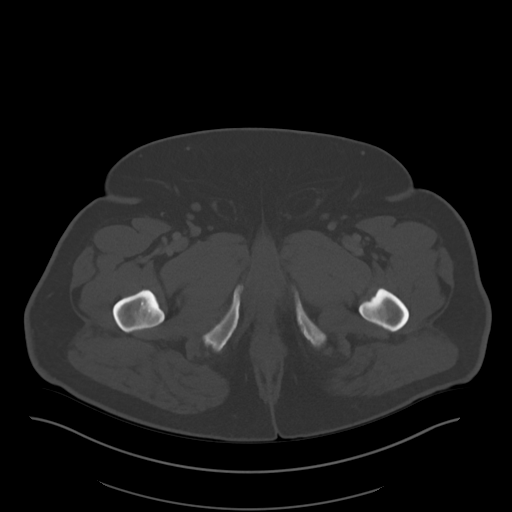
[im 15/95  soft-tissue]
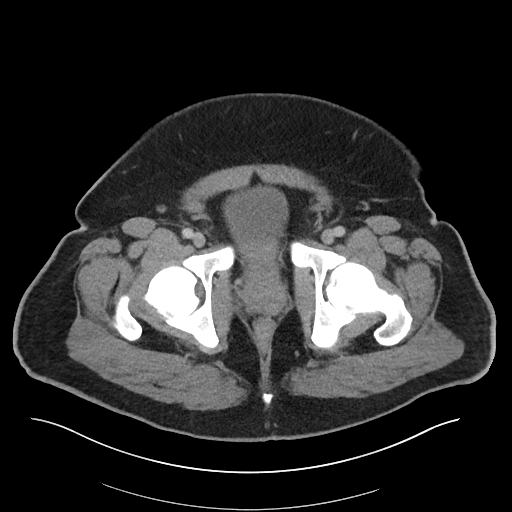
[im 20/95  soft-tissue]
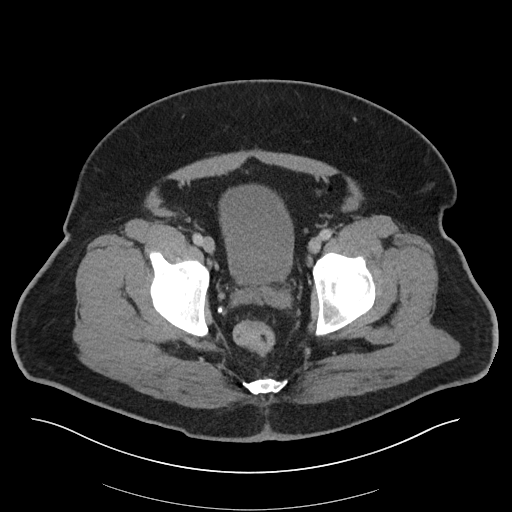
[im 25/95  soft-tissue]
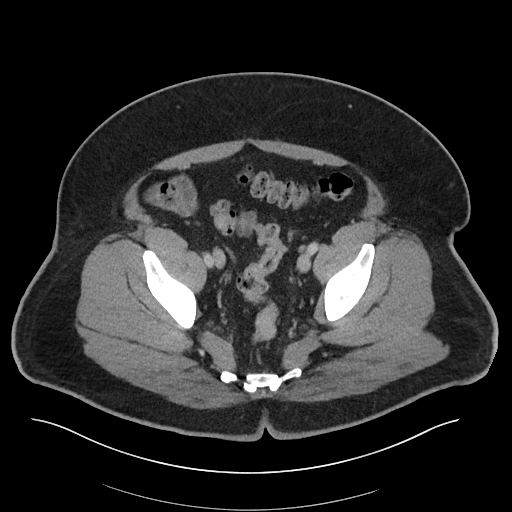
[im 35/95  soft-tissue]
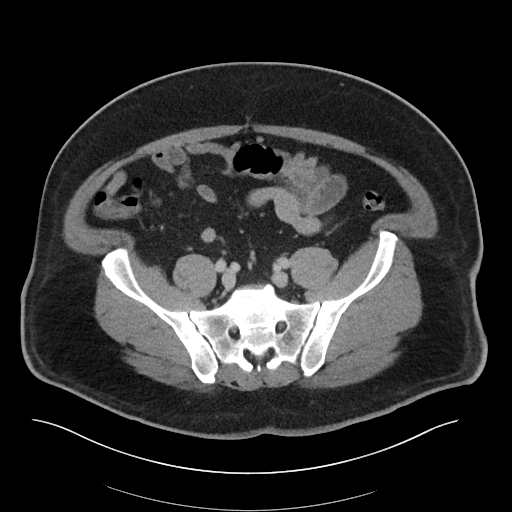
[im 40/95  soft-tissue]
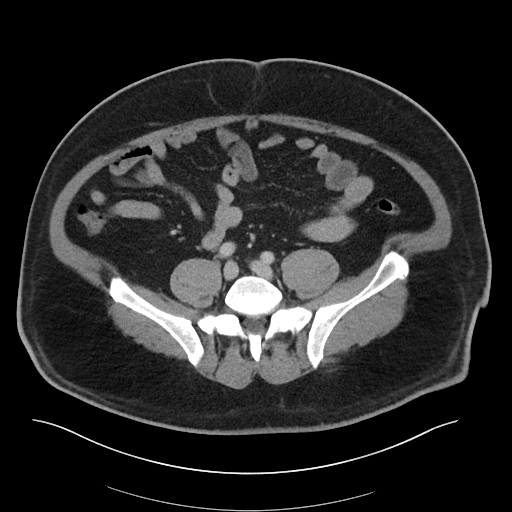
[im 50/95  soft-tissue]
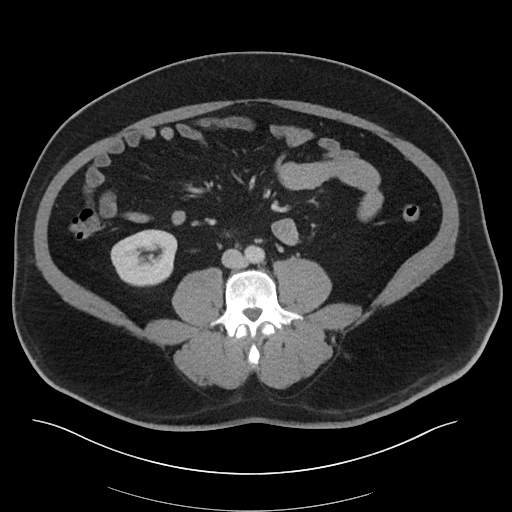
[im 55/95  soft-tissue]
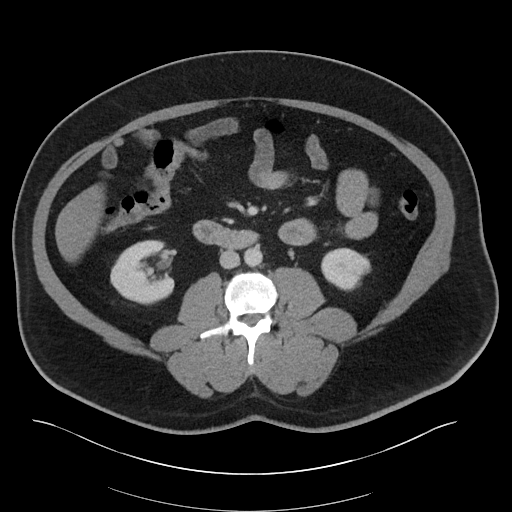
[im 60/95  soft-tissue]
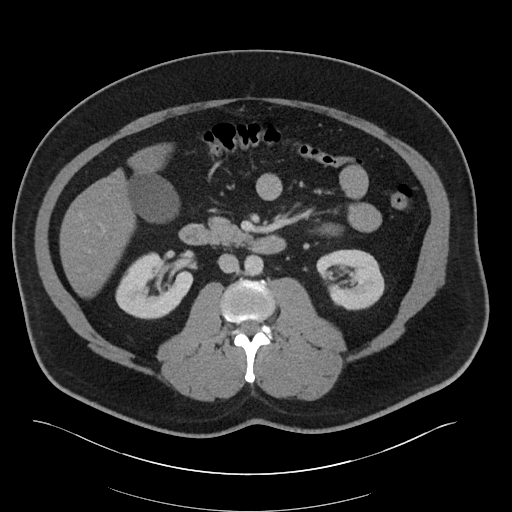
[im 60/95  bone]
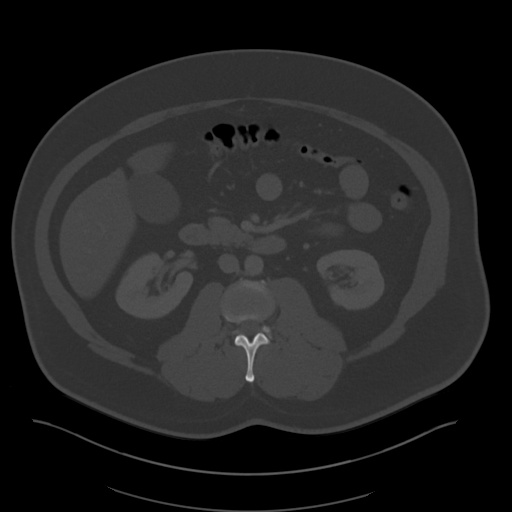
[im 70/95  soft-tissue]
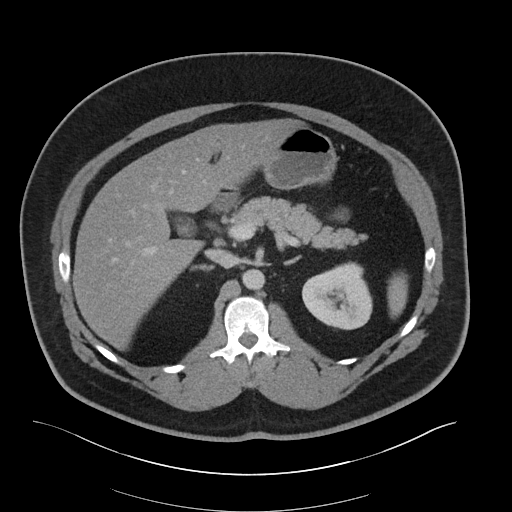
[im 75/95  soft-tissue]
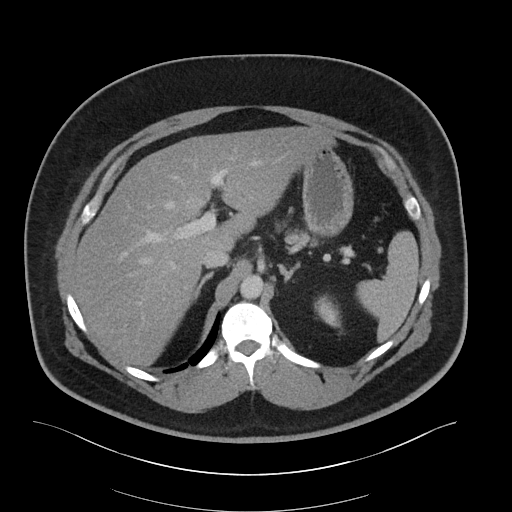
[im 80/95  soft-tissue]
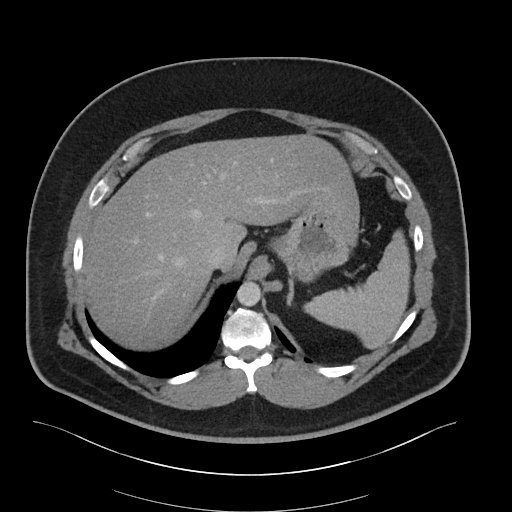
[im 90/95  soft-tissue]
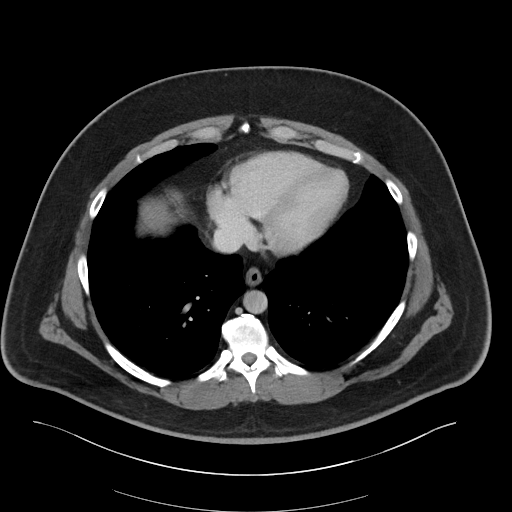

[Series 5: coronal · coronal · 0.94mm/px · 3 of 124 slices shown]
[im 42/124  soft-tissue]
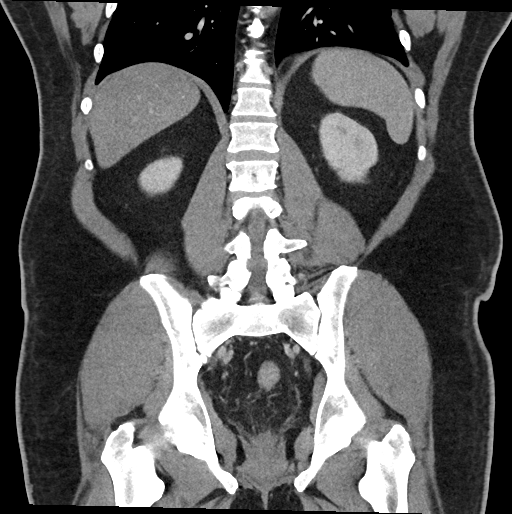
[im 55/124  soft-tissue]
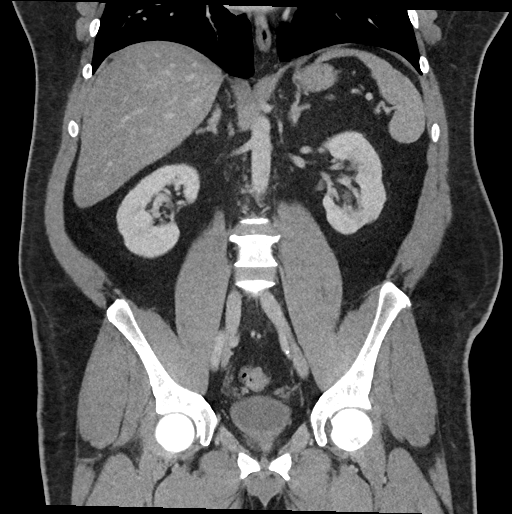
[im 69/124  soft-tissue]
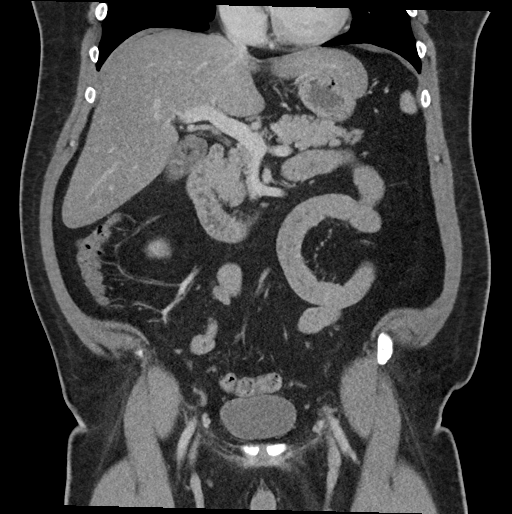

[16 of 46 positions shown; findings below may reference images not displayed]

RADIATION DOSE REDUCTION: This exam was performed according to the
departmental dose-optimization program which includes automated
exposure control, adjustment of the mA and/or kV according to
patient size and/or use of iterative reconstruction technique.

CONTRAST:  100mL OMNIPAQUE IOHEXOL 300 MG/ML  SOLN
FINDINGS: Lower chest: No acute abnormality.

Hepatobiliary: Fatty infiltration of the liver is noted. Gallbladder
is well distended with multiple dependent gallstones. No
complicating factors are noted.

Pancreas: Unremarkable. No pancreatic ductal dilatation or
surrounding inflammatory changes.

Spleen: Normal in size without focal abnormality.

Adrenals/Urinary Tract: Adrenal glands are within normal limits.
Kidneys demonstrate a normal enhancement pattern bilaterally. No
renal calculi or obstructive changes are seen. The bladder is within
normal limits.

Stomach/Bowel: No obstructive or inflammatory changes of the colon
are noted. Minimal diverticular changes noted. The appendix is
within normal limits. Small bowel and stomach are unremarkable.

Vascular/Lymphatic: No significant vascular findings are present. No
enlarged abdominal or pelvic lymph nodes.

Reproductive: Prostate is unremarkable.

Other: Fat containing inguinal hernias are again noted. No
incarceration is noted. No ascites is seen.

Musculoskeletal: Stable sclerotic densities in the pelvis likely
related to bone islands.
IMPRESSION: Overall stable appearance when compared with the prior exam.

Cholelithiasis without complicating factors.

Diverticulosis without diverticulitis.

No acute abnormality noted.

## 2024-02-15 NOTE — Progress Notes (Signed)
 Darrell Nunez                                          MRN: [id number]   02/15/2024   The [facility] Quality Team Specialist reviewed this patient medical record for the purposes of chart review for care gap closure. The following were reviewed: chart review for care gap closure-kidney health evaluation for diabetes:eGFR  and uACR.    [facility] Quality Team

## 2024-02-23 ENCOUNTER — Encounter: Payer: Self-pay | Admitting: Neurology

## 2024-02-23 ENCOUNTER — Ambulatory Visit (INDEPENDENT_AMBULATORY_CARE_PROVIDER_SITE_OTHER): Admitting: Neurology

## 2024-02-23 VITALS — BP 110/67 | HR 70 | Ht 68.0 in | Wt 309.0 lb

## 2024-02-23 DIAGNOSIS — R55 Syncope and collapse: Secondary | ICD-10-CM

## 2024-02-23 NOTE — Progress Notes (Signed)
 Follow-up Visit   Date: 02/23/2024    Darrell Nunez MRN: [id number] DOB: 05-Jun-1975    Darrell Nunez is a 48 y.o. right-handed Caucasian male with diabetes mellitus, bipolar disorder, panic attacks, hypertension, hyperlipidemia, anxiety/depression, and s/p cochlea implant returning to the clinic for follow-up of syncope.  The patient was accompanied to the clinic by wife who also provides collateral information.    IMPRESSION/PLAN: Syncope, ?vasovagal vs orthostatic.  Symptoms are not suggestive of seizures disorder or vertebrobasilar insufficiency.    - CT head wo contrast  - Routine EEG  Further recommendations pending results.  --------------------------------------------- UPDATE 02/23/2024:  He reports having two spells over the past 2 years where he fell.  In March, he was bending over to put something in the refrigerator and was closing the door, which caused his breathing pause and made him lightheaded.  It hit the refrigerator and landed on the floor.  He lost awareness for 20 seconds.  He did not have any confusion or fatigue.  Last year, he reports having breathing issue in the middle of the night and walking in the dark and lost balance and fell.  No clear loss of awareness.  Wife says that he has spells of slumping over for a few seconds.  His blood sugar has been normal.  Blood pressure has not been checked during these spells.     Medications:  Current Outpatient Medications on File Prior to Visit  Medication Sig Dispense Refill   ALPRAZolam  (XANAX ) 1 MG tablet Take 1 mg by mouth 4 (four) times daily as needed. Anxiety     amLODipine  (NORVASC ) 5 MG tablet Take 1 tablet (5 mg total) by mouth daily. 90 tablet 1   aspirin  81 MG chewable tablet Chew 81 mg by mouth daily.     atorvastatin  (LIPITOR) 10 MG tablet Take 1 tablet by mouth once daily 90 tablet 1   Continuous Glucose Sensor (FREESTYLE LIBRE 3 SENSOR) MISC Use to continuously monitor blood sugar, change every 15  days 2 each 3   gabapentin  (NEURONTIN ) 300 MG capsule Take 1 tablet at bedtime x 1 week, then increase to 1 tablet twice daily. 60 capsule 5   glimepiride  (AMARYL ) 4 MG tablet Take 1 tablet (4 mg total) by mouth daily before breakfast. 90 tablet 1   hydrochlorothiazide  (HYDRODIURIL ) 25 MG tablet TAKE 1 TABLET BY MOUTH ONCE DAILY IN THE MORNING 90 tablet 1   Insulin Pen Needle (NOVOFINE PLUS PEN NEEDLE) 32G X 4 MM MISC Use w/ ozempic  pen 30 each 3   metoprolol  tartrate (LOPRESSOR ) 100 MG tablet Take 1 tablet (100 mg total) by mouth 2 (two) times daily. 180 tablet 1   naproxen (NAPROSYN) 500 MG tablet Take 1 tablet by mouth 2 (two) times daily. Was advised to not take Naproxen with Meloxicam .     Oxcarbazepine  (TRILEPTAL ) 300 MG tablet Take 150 mg by mouth 2 (two) times daily.      pioglitazone  (ACTOS ) 30 MG tablet Take 1 tablet (30 mg total) by mouth daily. 90 tablet 1   Potassium 99 MG TABS Take by mouth. Takes 2 tablets daily     valsartan  (DIOVAN ) 320 MG tablet Take 1 tablet (320 mg total) by mouth daily. 90 tablet 0   ziprasidone  (GEODON ) 60 MG capsule Take 60 mg by mouth in the morning and at bedtime. (Patient taking differently: Take 120 mg by mouth at bedtime.)     meloxicam  (MOBIC ) 15 MG tablet Take 1 tablet (15 mg total)  by mouth daily as needed for pain. (Patient not taking: Reported on 02/23/2024) 30 tablet 1   No current facility-administered medications on file prior to visit.    Allergies:  Allergies  Allergen Reactions   Amlodipine  Besylate     Other reaction(s): migraines   Depakote [Divalproex Sodium] Nausea And Vomiting   Lamictal [Lamotrigine] Nausea And Vomiting   Lisinopril      Other reaction(s): cough; dry mouth with ACEI   Metformin Hcl     Other reaction(s): personality issues   Olmesartan     Other reaction(s): dry mouth    Vital Signs:  BP 110/67   Pulse 70   Ht 5' 8 (1.727 m)   Wt (!) 309 lb (140.2 kg)   SpO2 95%   BMI 46.98 kg/m   Neurological  Exam: MENTAL STATUS including orientation to time, place, person, recent and remote memory, attention span and concentration, language, and fund of knowledge is normal.  Speech is not dysarthric.  CRANIAL NERVES:  No visual field defects.  Pupils equal round and reactive to light.  Normal conjugate, extra-ocular eye movements in all directions of gaze.  No ptosis.  Face is symmetric. Palate elevates symmetrically.  Tongue is midline.  MOTOR:  Motor strength is 5/5 in all extremities.  No atrophy, fasciculations or abnormal movements.  No pronator drift.  Tone is normal.    MSRs:  Reflexes are 2+/4 throughout.  SENSORY:  Intact to vibration throughout.  COORDINATION/GAIT:  Normal finger-to- nose-finger.  Intact rapid alternating movements bilaterally.  Gait narrow based and stable.   Data: Lab Results  Component Value Date   TSH 1.04 06/12/2023     Thank you for allowing me to participate in patient's care.  If I can answer any additional questions, I would be pleased to do so.    Sincerely,    Melenda Bielak K. Tobie, DO

## 2024-03-04 ENCOUNTER — Other Ambulatory Visit

## 2024-03-04 ENCOUNTER — Encounter: Payer: Self-pay | Admitting: Neurology

## 2024-03-10 ENCOUNTER — Ambulatory Visit
Admission: RE | Admit: 2024-03-10 | Discharge: 2024-03-10 | Disposition: A | Discharge status: Home / Self Care | Source: Ambulatory Visit | Attending: Neurology | Admitting: Neurology

## 2024-03-10 DIAGNOSIS — R55 Syncope and collapse: Secondary | ICD-10-CM

## 2024-03-14 ENCOUNTER — Ambulatory Visit: Payer: Self-pay | Admitting: Neurology

## 2024-03-17 ENCOUNTER — Other Ambulatory Visit: Payer: Self-pay

## 2024-03-17 DIAGNOSIS — E1169 Type 2 diabetes mellitus with other specified complication: Secondary | ICD-10-CM

## 2024-03-17 MED ORDER — ATORVASTATIN CALCIUM 10 MG PO TABS: 10.0000 mg | ORAL_TABLET | Freq: Every day | ORAL | 1 refills | Status: DC

## 2024-03-17 NOTE — Telephone Encounter (Signed)
 Called patient and left a message for a call back.

## 2024-03-18 NOTE — Telephone Encounter (Signed)
 Called patient and left a message for a call back.

## 2024-03-24 ENCOUNTER — Other Ambulatory Visit: Payer: Self-pay | Admitting: Neurology

## 2024-03-24 DIAGNOSIS — E1159 Type 2 diabetes mellitus with other circulatory complications: Secondary | ICD-10-CM

## 2024-03-24 MED ORDER — HYDROCHLOROTHIAZIDE 25 MG PO TABS: 25.0000 mg | ORAL_TABLET | Freq: Every morning | ORAL | 0 refills | Status: DC

## 2024-04-29 ENCOUNTER — Other Ambulatory Visit: Payer: Self-pay | Admitting: Family Medicine

## 2024-04-29 NOTE — Telephone Encounter (Unsigned)
 Copied from CRM #[id number]. Topic: Clinical - Medication Refill >> Apr 29, 2024 10:48 AM Jakyia R wrote: Medication: metoprolol  tartrate (LOPRESSOR ) 100 MG tablet [[id number]],  pioglitazone  (ACTOS ) 30 MG tablet   Has the patient contacted their pharmacy? Yes, Pt states he requested refill through pharmacy but havnt heard anything back  (Agent: If no, request that the patient contact the pharmacy for the refill. If patient does not wish to contact the pharmacy document the reason why and proceed with request.) (Agent: If yes, when and what did the pharmacy advise?)  This is the patient's preferred pharmacy:  [facility] [address], [place] - [address] [place] [address] Phone: [phone] Fax: [phone]  Is this the correct pharmacy for this prescription? Yes If no, delete pharmacy and type the correct one.   Has the prescription been filled recently? Yes  Is the patient out of the medication? No, but will be over the weekend.   Has the patient been seen for an appointment in the last year OR does the patient have an upcoming appointment? Yes  Can we respond through MyChart? Yes  Agent: Please be advised that Rx refills may take up to 3 business days. We ask that you follow-up with your pharmacy.

## 2024-05-01 MED ORDER — PIOGLITAZONE HCL 30 MG PO TABS: 30.0000 mg | ORAL_TABLET | Freq: Every day | ORAL | 1 refills | Status: DC

## 2024-05-01 MED ORDER — METOPROLOL TARTRATE 100 MG PO TABS: 100.0000 mg | ORAL_TABLET | Freq: Two times a day (BID) | ORAL | 1 refills | Status: DC

## 2024-05-13 ENCOUNTER — Other Ambulatory Visit: Payer: Self-pay | Admitting: Family

## 2024-06-07 DIAGNOSIS — Z9621 Cochlear implant status: Secondary | ICD-10-CM | POA: Insufficient documentation

## 2024-06-07 DIAGNOSIS — F4001 Agoraphobia with panic disorder: Secondary | ICD-10-CM | POA: Insufficient documentation

## 2024-06-07 DIAGNOSIS — F319 Bipolar disorder, unspecified: Secondary | ICD-10-CM | POA: Insufficient documentation

## 2024-06-07 DIAGNOSIS — E7800 Pure hypercholesterolemia, unspecified: Secondary | ICD-10-CM | POA: Insufficient documentation

## 2024-06-07 DIAGNOSIS — G4719 Other hypersomnia: Secondary | ICD-10-CM | POA: Insufficient documentation

## 2024-06-09 ENCOUNTER — Encounter: Payer: Self-pay | Admitting: Family Medicine

## 2024-06-09 ENCOUNTER — Ambulatory Visit: Admitting: Family Medicine

## 2024-06-09 VITALS — BP 112/68 | HR 67 | Ht 68.0 in | Wt 312.0 lb

## 2024-06-09 DIAGNOSIS — Z0000 Encounter for general adult medical examination without abnormal findings: Secondary | ICD-10-CM

## 2024-06-09 DIAGNOSIS — F3132 Bipolar disorder, current episode depressed, moderate: Secondary | ICD-10-CM | POA: Diagnosis not present

## 2024-06-09 DIAGNOSIS — E1169 Type 2 diabetes mellitus with other specified complication: Secondary | ICD-10-CM

## 2024-06-09 DIAGNOSIS — I152 Hypertension secondary to endocrine disorders: Secondary | ICD-10-CM | POA: Diagnosis not present

## 2024-06-09 DIAGNOSIS — E1159 Type 2 diabetes mellitus with other circulatory complications: Secondary | ICD-10-CM

## 2024-06-09 DIAGNOSIS — Z7984 Long term (current) use of oral hypoglycemic drugs: Secondary | ICD-10-CM

## 2024-06-09 DIAGNOSIS — I10 Essential (primary) hypertension: Secondary | ICD-10-CM | POA: Diagnosis not present

## 2024-06-09 DIAGNOSIS — E785 Hyperlipidemia, unspecified: Secondary | ICD-10-CM | POA: Diagnosis not present

## 2024-06-09 DIAGNOSIS — E119 Type 2 diabetes mellitus without complications: Secondary | ICD-10-CM

## 2024-06-09 MED ORDER — ATORVASTATIN CALCIUM 10 MG PO TABS: 10.0000 mg | ORAL_TABLET | Freq: Every day | ORAL | 1 refills | Status: AC

## 2024-06-09 MED ORDER — PIOGLITAZONE HCL 30 MG PO TABS: 30.0000 mg | ORAL_TABLET | Freq: Every day | ORAL | 1 refills | Status: AC

## 2024-06-09 MED ORDER — VALSARTAN 320 MG PO TABS: 320.0000 mg | ORAL_TABLET | Freq: Every day | ORAL | 0 refills | Status: AC

## 2024-06-09 MED ORDER — AMLODIPINE BESYLATE 5 MG PO TABS: 5.0000 mg | ORAL_TABLET | Freq: Every day | ORAL | 1 refills | Status: AC

## 2024-06-09 MED ORDER — METOPROLOL TARTRATE 100 MG PO TABS: 100.0000 mg | ORAL_TABLET | Freq: Two times a day (BID) | ORAL | 1 refills | Status: AC

## 2024-06-09 MED ORDER — HYDROCHLOROTHIAZIDE 25 MG PO TABS: 25.0000 mg | ORAL_TABLET | Freq: Every morning | ORAL | 1 refills | Status: AC

## 2024-06-09 MED ORDER — GLIMEPIRIDE 4 MG PO TABS: 4.0000 mg | ORAL_TABLET | Freq: Every day | ORAL | 1 refills | Status: AC

## 2024-06-09 NOTE — Assessment & Plan Note (Signed)
 Encouraged low carb diet, portion control, and regular exercise as tolerated.

## 2024-06-09 NOTE — Assessment & Plan Note (Signed)
 Diabetes well-controlled with blood glucose mostly in 80s to low 100s, occasional spikes to 130. Uses finger prick for monitoring. Started zero sugar drinks for dietary management. - Continue glimepiride  4 mg daily. - Continue pioglitazone  30 mg daily. - Ordered diabetic eye exam referral.

## 2024-06-09 NOTE — Assessment & Plan Note (Signed)
 Mood stable with current medications. Under psychiatrist care, upcoming appointment. Takes Trileptal , Geodon , Xanax  as needed. No side effects reported. No SI/HI. PHQ9/GAD7 reviewed.  - Requested psychiatry office notes to confirm medication regimen. - Continue current psychiatric medications. - Ensure follow-up with psychiatrist on January 29th.

## 2024-06-09 NOTE — Assessment & Plan Note (Signed)
 Medication management: atorvastatin  10 mg daily Lifestyle factors for lowering cholesterol include: Diet therapy - heart-healthy diet rich in fruits, veggies, fiber-rich whole grains, lean meats, chicken, fish (at least twice a week), fat-free or 1% dairy products; foods low in saturated/trans fats, cholesterol, sodium, and sugar. Mediterranean diet has shown to be very heart healthy. Regular exercise - recommend at least 30 minutes a day, 5 times per week Weight management

## 2024-06-09 NOTE — Progress Notes (Signed)
 "   New Patient Office Visit   Subjective     Patient ID: Darrell Nunez, male   DOB: March 27, 1976  Age: 49 y.o. MRN: [id number]   CC:  Chief Complaint  Patient presents with   Establish Care      HPI Darrell Nunez presents to establish care. He lives with wife, step-daughter (43 years old). He is on disability.   Discussed the use of AI scribe software for clinical note transcription with the patient, who gave verbal consent to proceed.  History of Present Illness Darrell Nunez is a 49 year old male who presents for medication review and management.  He is currently on Geodon  120 mg at night and Trileptal  900 mg at bedtime for mental health management, prescribed by a psychiatrist at [facility] in [place]. He has an upcoming appointment on the 29th. He also uses Xanax  1 mg as needed. No side effects from these medications have been reported, and he has been on them for a long time. No thoughts of self-harm.  He has a history of diabetes and monitors his blood glucose. His glucose levels range between the 80s and low 100s, occasionally spiking to 130. He takes glimepiride  4 mg and pioglitazone  30 mg for diabetes management. He recently started consuming zero-sugar drinks as part of a healthier diet but does not engage in regular exercise due to residual shoulder pain from a past torn labrum.  He has a history of hypertension and hyperlipidemia, managed with atorvastatin  10 mg, amlodipine  5 mg, metoprolol  100 mg twice a day, valsartan  320 mg, and hydrochlorothiazide  25 mg. He does not currently monitor his blood pressure at home due to a broken cuff but reports good control during office visits. He also takes a baby aspirin  daily.  He has a history of syncopal episodes, previously evaluated by a neurologist in October, with no alarming findings. A CT scan showed no acute changes, and he has not experienced further episodes. He has a history of low potassium levels and takes a multivitamin  with potassium supplementation.     Diabetes: - Checking glucose at home: 80-100s  - Medications: Glimepiride  4 mg daily, Actos  30 mg daily - Compliance: good - Diet: trying low carb - Exercise: minimal  - Eye exam: referral placed - Foot exam:  - Microalbumin:  - Denies symptoms of hypoglycemia, polyuria, polydipsia, foot ulcers/trauma, wounds that are not healing, medication side effects  - Takes Gabapentin  300 mg BID for neuropathy. Lab Results  Component Value Date   HGBA1C 6.5 (H) 12/11/2023    Hyperlipidemia: - medications: Atorvastatin  10 mg daily.  - compliance: good - medication SEs: none The 10-year ASCVD risk score (Arnett DK, et al., 2019) is: 5.7%   Values used to calculate the score:     Age: 8 years     Clinically relevant sex: Male     Is Non-Hispanic African American: No     Diabetic: Yes     Tobacco smoker: No     Systolic Blood Pressure: 112 mmHg     Is BP treated: Yes     HDL Cholesterol: 44 mg/dL     Total Cholesterol: 186 mg/dL  Hypertension: - Medications: Amlodipine  5 mg daily, Metoprolol  tartrate 100 mg BID, Valsartan  320 mg daily, and HCTZ 25 mg daily. - Compliance: good - Checking BP at home: rarely - Denies any SOB, recurrent headaches, CP, vision changes, LE edema, dizziness, palpitations, or medication side effects.  BP Readings from Last 3 Encounters:  06/09/24 112/68  02/23/24 110/67  12/11/23 111/76    Mood follow-up: - Diagnosis: Bipolar Disorder, Depressed - Treatment: Trileptal  150 mg BID (thinks he's doing 900 mg at bedtime), Ziprasidone  120 mg daily, PRN Xanax  1 mg - Medication side effects: none - SI/HI: no - Update: Feels stable.  - He follows with [facility] on [facility]     06/09/2024    2:37 PM 04/10/2023    3:03 PM  Depression screen PHQ 2/9  Decreased Interest 1 1  Down, Depressed, Hopeless 1 0  PHQ - 2 Score 2 1  Altered sleeping 2   Tired, decreased energy 2   Change in appetite 0   Feeling bad or  failure about yourself  1   Trouble concentrating 0   Moving slowly or fidgety/restless 0   Suicidal thoughts 0   PHQ-9 Score 7   Difficult doing work/chores Somewhat difficult         06/09/2024    2:38 PM  GAD 7 : Generalized Anxiety Score  Nervous, Anxious, on Edge 1  Control/stop worrying 1  Worry too much - different things 1  Trouble relaxing 1  Restless 0  Easily annoyed or irritable 1  Afraid - awful might happen 1  Total GAD 7 Score 6  Anxiety Difficulty Somewhat difficult      Show/hide medication list[1] Past Medical History:  Diagnosis Date   Anxiety    Bipolar disorder (HCC)    Chest pain, unspecified 03/05/2015   Depression    Hearing impairment    Hypertension    Panic attacks    Pure hypercholesterolemia 09/09/2021    Past Surgical History:  Procedure Laterality Date   INNER EAR SURGERY       Family History  Problem Relation Age of Onset   Other Mother        factor five leiden   Epilepsy Mother    Seizures Mother    Diabetes Maternal Grandmother    Heart disease Maternal Grandmother        Pacemaker   Heart disease Maternal Grandfather    Heart attack Maternal Grandfather    Diabetes Maternal Grandfather    Colon cancer Neg Hx    Colon polyps Neg Hx    Pancreatic cancer Neg Hx    Liver cancer Neg Hx    Stomach cancer Neg Hx     Social History   Socioeconomic History   Marital status: Married    Spouse name: Not on file   Number of children: 0   Years of education: Not on file   Highest education level: Not on file  Occupational History   Not on file  Tobacco Use   Smoking status: Never    Passive exposure: Never   Smokeless tobacco: Never  Vaping Use   Vaping status: Never Used  Substance and Sexual Activity   Alcohol use: Yes    Comment: One drink every 2-3 months   Drug use: No   Sexual activity: Yes  Other Topics Concern   Not on file  Social History Narrative   Right Handed    Lives in a one story home. Lives  with wife. Has one step daughter.       Social Drivers of Health   Tobacco Use: Low Risk (06/09/2024)   Patient History    Smoking Tobacco Use: Never    Smokeless Tobacco Use: Never    Passive Exposure: Never  Financial Resource Strain: Not on file  Food  Insecurity: Not on file  Transportation Needs: Not on file  Physical Activity: Not on file  Stress: Not on file  Social Connections: Not on file  Depression (PHQ2-9): Medium Risk (06/09/2024)   Depression (PHQ2-9)    PHQ-2 Score: 7  Alcohol Screen: Not on file  Housing: Not on file  Utilities: Not on file  Health Literacy: Not on file       ROS All review of systems negative except what is listed in the HPI    Objective     BP 112/68   Pulse 67   Ht 5' 8 (1.727 m)   Wt (!) 312 lb (141.5 kg)   SpO2 96%   BMI 47.44 kg/m   Physical Exam Vitals reviewed.  Constitutional:      General: He is not in acute distress.    Appearance: Normal appearance. He is obese. He is not ill-appearing.  Cardiovascular:     Rate and Rhythm: Normal rate and regular rhythm.     Heart sounds: Normal heart sounds.  Pulmonary:     Effort: Pulmonary effort is normal.     Breath sounds: Normal breath sounds.  Skin:    General: Skin is warm and dry.  Neurological:     Mental Status: He is alert and oriented to person, place, and time.  Psychiatric:        Mood and Affect: Mood normal.        Behavior: Behavior normal.        Thought Content: Thought content normal.        Judgment: Judgment normal.          Assessment & Plan:     Problem List Items Addressed This Visit       Active Problems   Bipolar affective disorder, depressed, moderate (HCC) (Chronic)   Mood stable with current medications. Under psychiatrist care, upcoming appointment. Takes Trileptal , Geodon , Xanax  as needed. No side effects reported. No SI/HI. PHQ9/GAD7 reviewed.  - Requested psychiatry office notes to confirm medication regimen. - Continue current  psychiatric medications. - Ensure follow-up with psychiatrist on January 29th.      Morbid obesity (HCC)   Encouraged low carb diet, portion control, and regular exercise as tolerated.       Relevant Medications   glimepiride  (AMARYL ) 4 MG tablet   pioglitazone  (ACTOS ) 30 MG tablet   Hyperlipidemia associated with type 2 diabetes mellitus (HCC)   Medication management: atorvastatin  10 mg daily Lifestyle factors for lowering cholesterol include: Diet therapy - heart-healthy diet rich in fruits, veggies, fiber-rich whole grains, lean meats, chicken, fish (at least twice a week), fat-free or 1% dairy products; foods low in saturated/trans fats, cholesterol, sodium, and sugar. Mediterranean diet has shown to be very heart healthy. Regular exercise - recommend at least 30 minutes a day, 5 times per week Weight management          Relevant Medications   amLODipine  (NORVASC ) 5 MG tablet   atorvastatin  (LIPITOR) 10 MG tablet   glimepiride  (AMARYL ) 4 MG tablet   hydrochlorothiazide  (HYDRODIURIL ) 25 MG tablet   metoprolol  tartrate (LOPRESSOR ) 100 MG tablet   pioglitazone  (ACTOS ) 30 MG tablet   valsartan  (DIOVAN ) 320 MG tablet   Benign essential hypertension   Blood pressure is at goal for age and co-morbidities.   Recommendations: continue amlodipine  5 mg daily, metoprolol  100 mg BID, valsartan  320 mg daily, hctz 25 mg daily - BP goal <130/80 - monitor and log blood pressures at home - check  around the same time each day in a relaxed setting - Limit salt to <2000 mg/day - Follow DASH eating plan (heart healthy diet) - limit alcohol to 2 standard drinks per day for men and 1 per day for women - avoid tobacco products - get at least 2 hours of regular aerobic exercise weekly Patient aware of signs/symptoms requiring further/urgent evaluation.        Relevant Medications   amLODipine  (NORVASC ) 5 MG tablet   atorvastatin  (LIPITOR) 10 MG tablet   hydrochlorothiazide  (HYDRODIURIL ) 25  MG tablet   metoprolol  tartrate (LOPRESSOR ) 100 MG tablet   valsartan  (DIOVAN ) 320 MG tablet   Hypertension associated with diabetes (HCC)    >>ASSESSMENT AND PLAN FOR HYPERTENSION ASSOCIATED WITH DIABETES (HCC) WRITTEN ON 06/09/2024  4:52 PM BY Maegan Buller B, NP  Managing as above. Stable today.    >>ASSESSMENT AND PLAN FOR TYPE 2 DIABETES MELLITUS WITH OTHER SPECIFIED COMPLICATION (HCC) WRITTEN ON 06/09/2024  4:51 PM BY Bernedette Auston B, NP  Diabetes well-controlled with blood glucose mostly in 80s to low 100s, occasional spikes to 130. Uses finger prick for monitoring. Started zero sugar drinks for dietary management. - Continue glimepiride  4 mg daily. - Continue pioglitazone  30 mg daily. - Ordered diabetic eye exam referral.      Relevant Medications   amLODipine  (NORVASC ) 5 MG tablet   atorvastatin  (LIPITOR) 10 MG tablet   glimepiride  (AMARYL ) 4 MG tablet   hydrochlorothiazide  (HYDRODIURIL ) 25 MG tablet   metoprolol  tartrate (LOPRESSOR ) 100 MG tablet   pioglitazone  (ACTOS ) 30 MG tablet   valsartan  (DIOVAN ) 320 MG tablet   Other Visit Diagnoses       Type 2 diabetes mellitus without complication, without long-term current use of insulin (HCC)    -  Primary   Relevant Medications   atorvastatin  (LIPITOR) 10 MG tablet   glimepiride  (AMARYL ) 4 MG tablet   pioglitazone  (ACTOS ) 30 MG tablet   valsartan  (DIOVAN ) 320 MG tablet   Other Relevant Orders   Hemoglobin A1c   Microalbumin / creatinine urine ratio   Comprehensive metabolic panel with GFR   Ambulatory referral to Ophthalmology     Encounter for medical examination to establish care                    Return in about 6 months (around 12/07/2024) for physical; lab appointment in the next week or so.  Waddell KATHEE Mon, NP  I,Emily Lagle,acting as a scribe for Waddell KATHEE Mon, NP.,have documented all relevant documentation on the behalf of Waddell KATHEE Mon, NP.  I, Waddell KATHEE Mon, NP, have reviewed all documentation for this  visit. The documentation on 06/09/2024 for the exam, diagnosis, procedures, and orders are all accurate and complete.      [1]  Outpatient Medications Prior to Visit  Medication Sig   ALPRAZolam  (XANAX ) 1 MG tablet Take 1 mg by mouth 4 (four) times daily as needed. Anxiety   aspirin  81 MG chewable tablet Chew 81 mg by mouth daily.   Oxcarbazepine  (TRILEPTAL ) 300 MG tablet Take 150 mg by mouth 2 (two) times daily.    Potassium 99 MG TABS Take by mouth. Takes 2 tablets daily   ziprasidone  (GEODON ) 60 MG capsule Take 60 mg by mouth in the morning and at bedtime. (Patient taking differently: Take 120 mg by mouth at bedtime.)   [DISCONTINUED] amLODipine  (NORVASC ) 5 MG tablet Take 1 tablet (5 mg total) by mouth daily.   [DISCONTINUED] atorvastatin  (LIPITOR) 10 MG  tablet Take 1 tablet (10 mg total) by mouth daily.   [DISCONTINUED] Continuous Glucose Sensor (FREESTYLE LIBRE 3 SENSOR) MISC Use to continuously monitor blood sugar, change every 15 days   [DISCONTINUED] gabapentin  (NEURONTIN ) 300 MG capsule Take 1 tablet at bedtime x 1 week, then increase to 1 tablet twice daily.   [DISCONTINUED] glimepiride  (AMARYL ) 4 MG tablet Take 1 tablet (4 mg total) by mouth daily before breakfast.   [DISCONTINUED] hydrochlorothiazide  (HYDRODIURIL ) 25 MG tablet Take 1 tablet (25 mg total) by mouth every morning.   [DISCONTINUED] Insulin Pen Needle (NOVOFINE PLUS PEN NEEDLE) 32G X 4 MM MISC Use w/ ozempic  pen   [DISCONTINUED] meloxicam  (MOBIC ) 15 MG tablet Take 1 tablet (15 mg total) by mouth daily as needed for pain.   [DISCONTINUED] metoprolol  tartrate (LOPRESSOR ) 100 MG tablet Take 1 tablet (100 mg total) by mouth 2 (two) times daily.   [DISCONTINUED] naproxen (NAPROSYN) 500 MG tablet Take 1 tablet by mouth 2 (two) times daily. Was advised to not take Naproxen with Meloxicam .   [DISCONTINUED] pioglitazone  (ACTOS ) 30 MG tablet Take 1 tablet (30 mg total) by mouth daily.   [DISCONTINUED] valsartan  (DIOVAN ) 320 MG  tablet Take 1 tablet by mouth once daily   No facility-administered medications prior to visit.   "

## 2024-06-09 NOTE — Assessment & Plan Note (Signed)
 Blood pressure is at goal for age and co-morbidities.   Recommendations: continue amlodipine  5 mg daily, metoprolol  100 mg BID, valsartan  320 mg daily, hctz 25 mg daily - BP goal <130/80 - monitor and log blood pressures at home - check around the same time each day in a relaxed setting - Limit salt to <2000 mg/day - Follow DASH eating plan (heart healthy diet) - limit alcohol to 2 standard drinks per day for men and 1 per day for women - avoid tobacco products - get at least 2 hours of regular aerobic exercise weekly Patient aware of signs/symptoms requiring further/urgent evaluation.

## 2024-06-09 NOTE — Assessment & Plan Note (Addendum)
" >>  ASSESSMENT AND PLAN FOR HYPERTENSION ASSOCIATED WITH DIABETES (HCC) WRITTEN ON 06/09/2024  4:52 PM BY Aviya Jarvie B, NP  Managing as above. Stable today.    >>ASSESSMENT AND PLAN FOR TYPE 2 DIABETES MELLITUS WITH OTHER SPECIFIED COMPLICATION (HCC) WRITTEN ON 06/09/2024  4:51 PM BY Jaidah Lomax B, NP  Diabetes well-controlled with blood glucose mostly in 80s to low 100s, occasional spikes to 130. Uses finger prick for monitoring. Started zero sugar drinks for dietary management. - Continue glimepiride  4 mg daily. - Continue pioglitazone  30 mg daily. - Ordered diabetic eye exam referral. "

## 2024-06-14 ENCOUNTER — Other Ambulatory Visit: Payer: Self-pay | Admitting: Family Medicine

## 2024-12-08 ENCOUNTER — Encounter: Admitting: Family Medicine
